# Patient Record
Sex: Female | Born: 2000 | Race: Black or African American | Hispanic: No | Marital: Single | Smoking: Never smoker
Health system: Southern US, Community
[De-identification: ages and names within clinical notes are randomized; demographics above are authoritative.]

## PROBLEM LIST (undated history)

## (undated) ENCOUNTER — Other Ambulatory Visit (HOSPITAL_COMMUNITY): Admission: EM | Discharge status: Absent without leave | Payer: Self-pay

## (undated) DIAGNOSIS — T50902A Poisoning by unspecified drugs, medicaments and biological substances, intentional self-harm, initial encounter: Secondary | ICD-10-CM

## (undated) DIAGNOSIS — D649 Anemia, unspecified: Secondary | ICD-10-CM

## (undated) DIAGNOSIS — F32A Depression, unspecified: Secondary | ICD-10-CM

## (undated) DIAGNOSIS — F329 Major depressive disorder, single episode, unspecified: Secondary | ICD-10-CM

---
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
## 2021-02-22 IMAGING — CR DG CHEST 2V
2 series · 2 of 2 positions shown · non-contrast
Comparison: 01/24/2020.  Chest CT 01/28/2020

CLINICAL DATA: Current treatment for tuberculosis.

EXAM:
CHEST - 2 VIEW

[w chest pa]
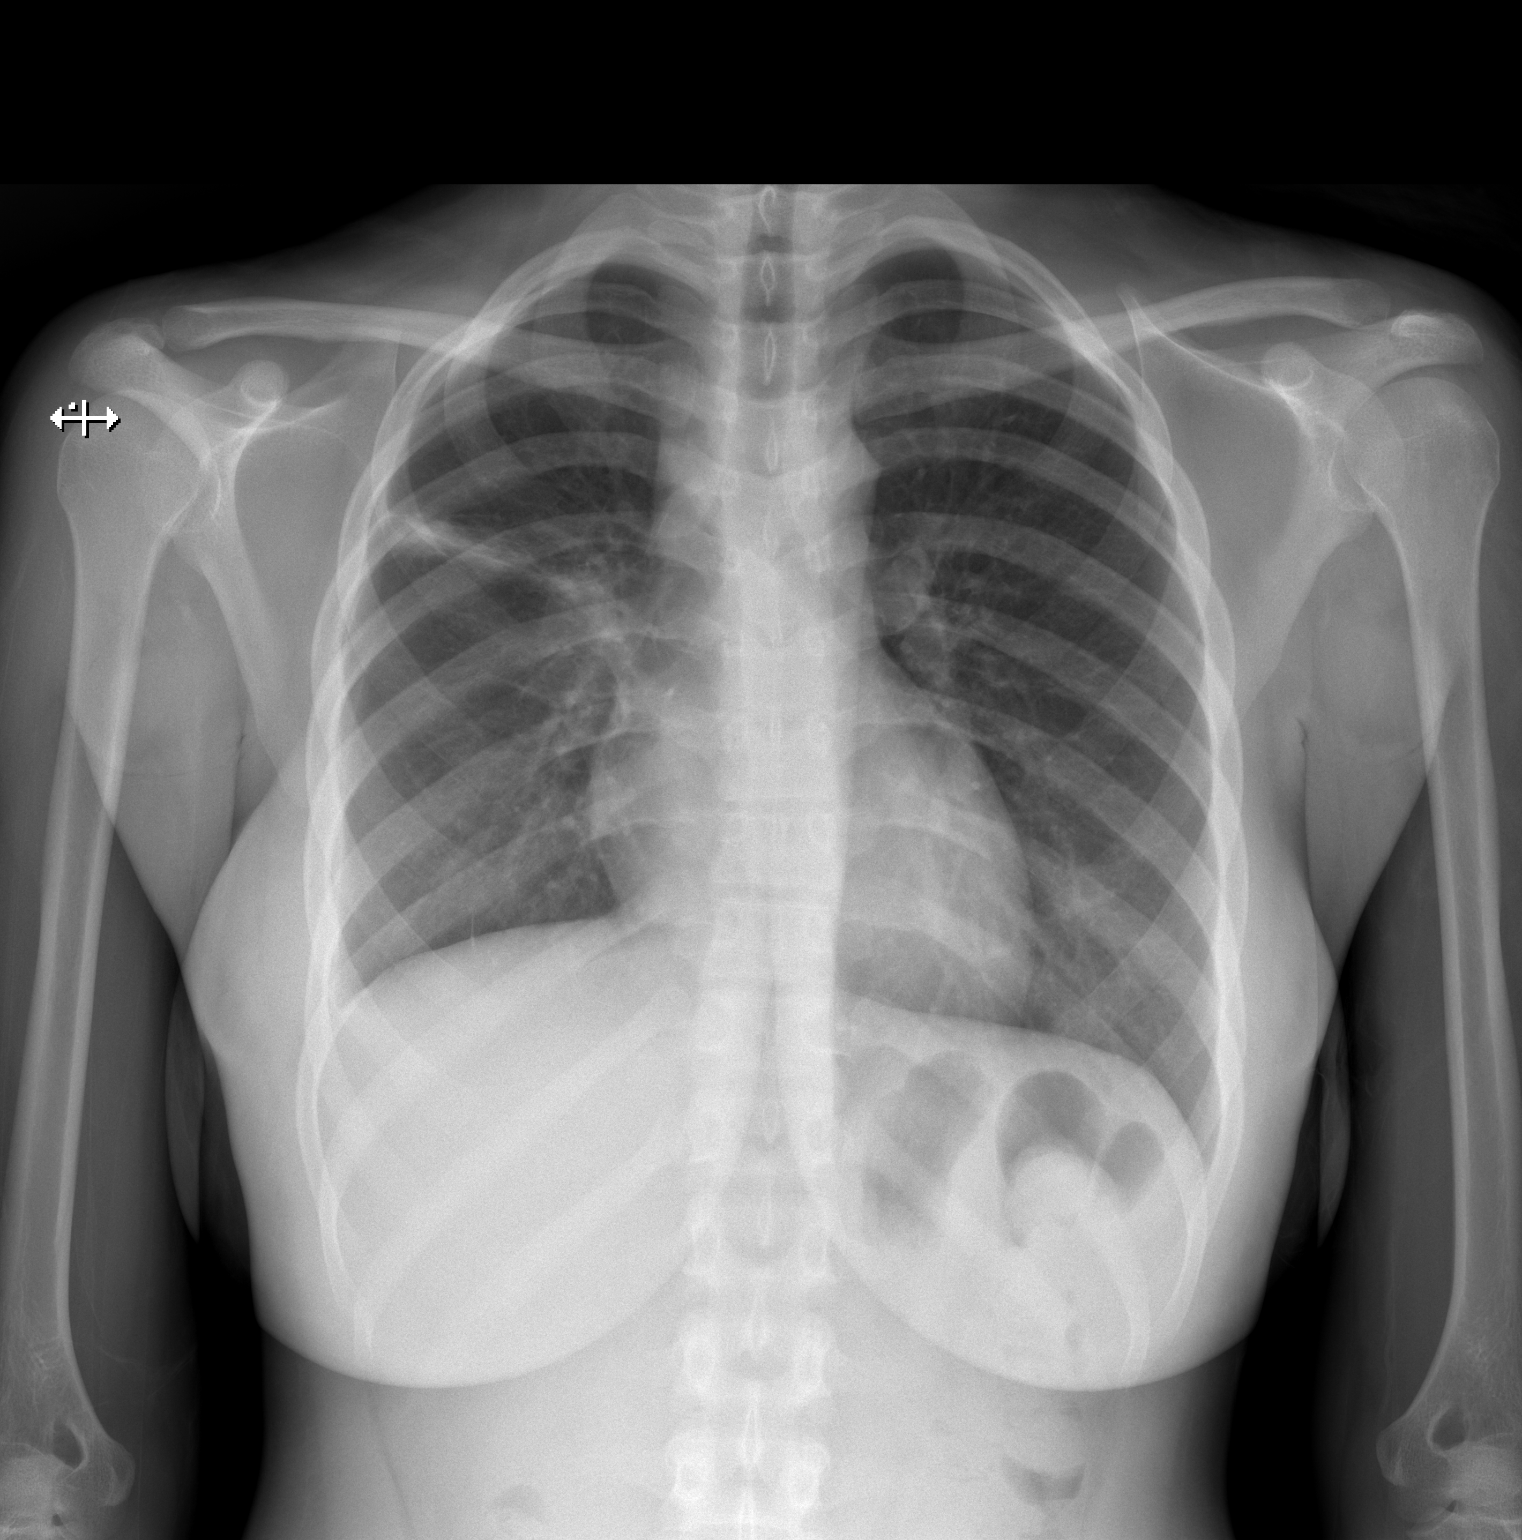

[w chest lat]
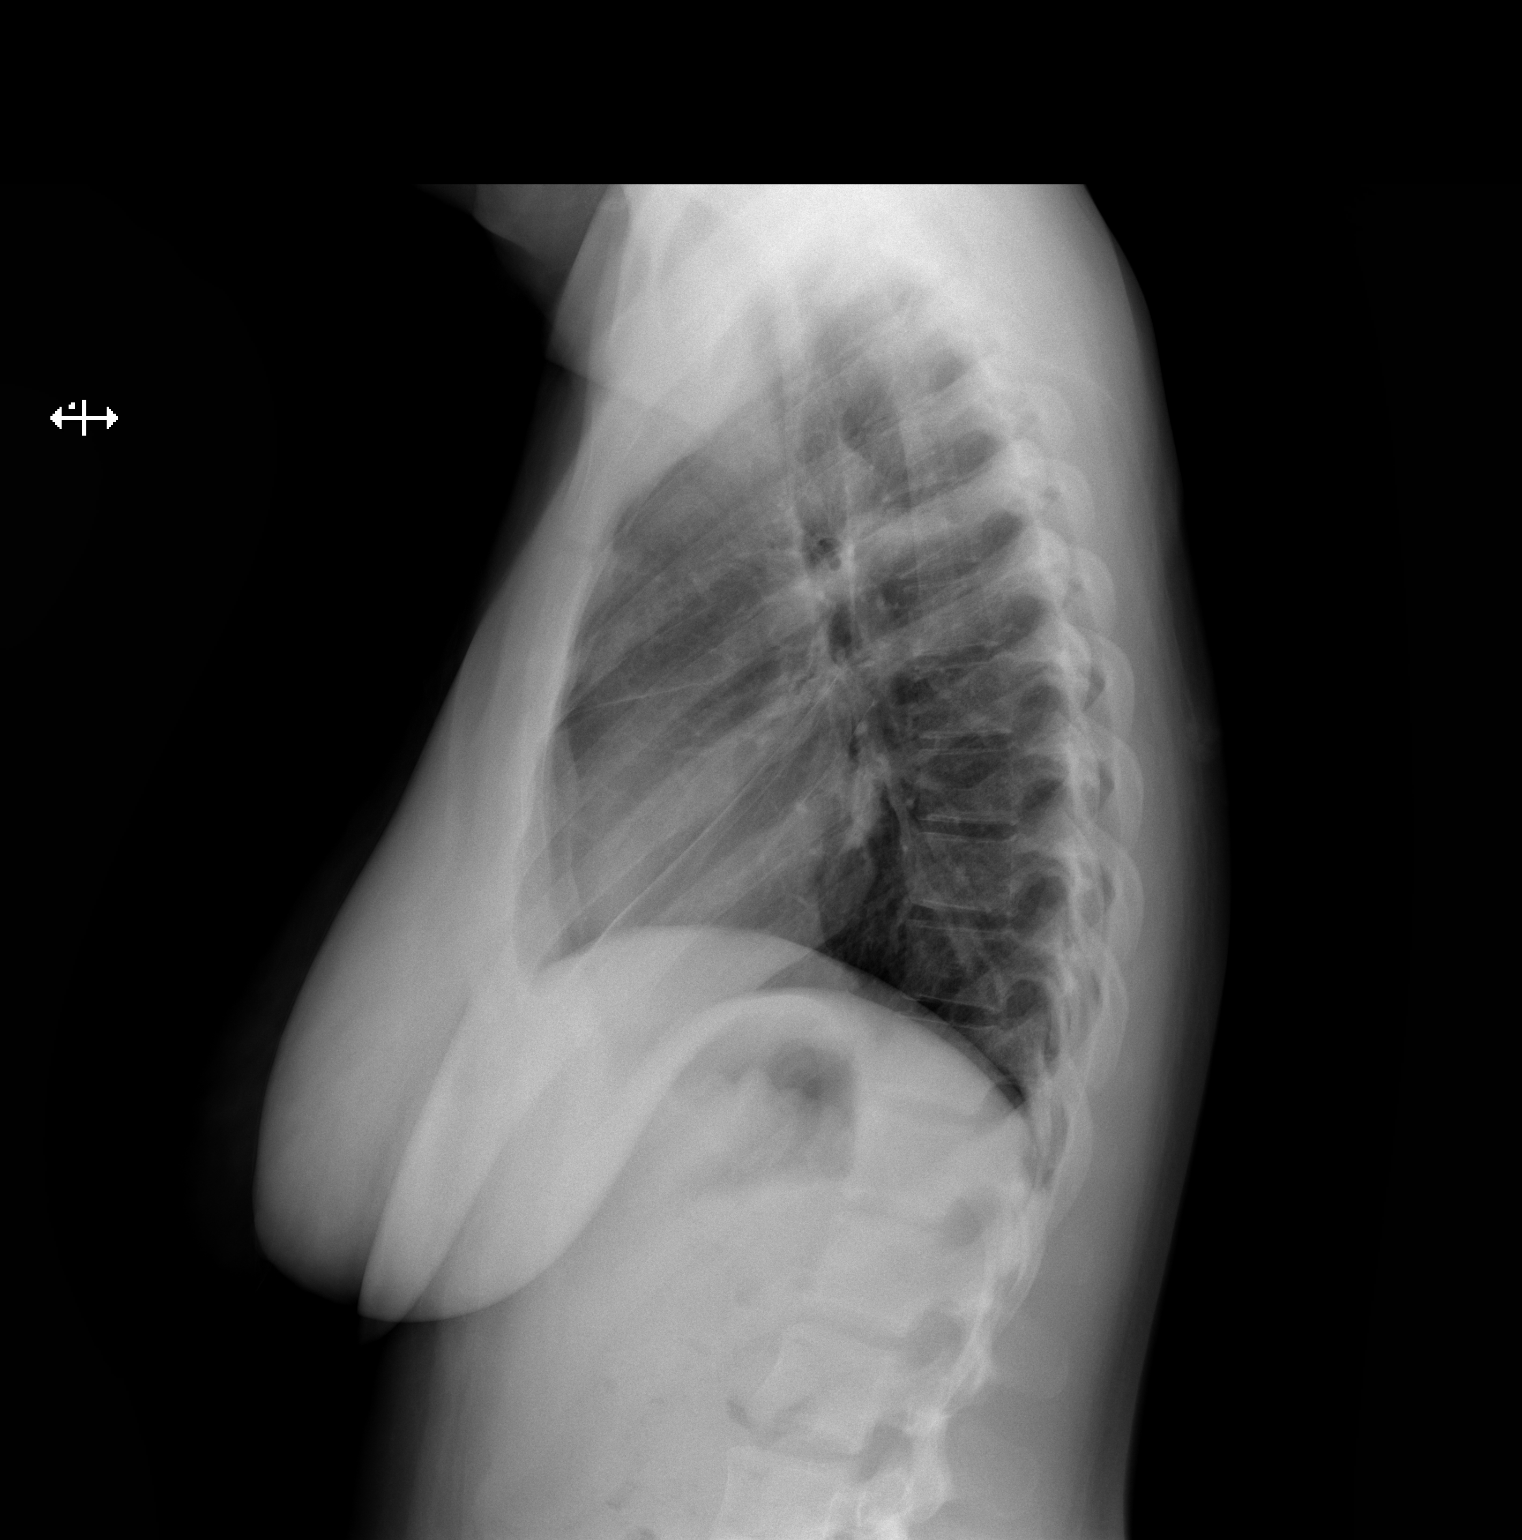

[2 of 2 positions shown; findings below may reference images not displayed]

FINDINGS: Interval improvement in right paratracheal adenopathy. Mild
improvement in patchy infiltrate in the right upper lobe. Interval
improvement in right pleural effusion

Left lung remains clear.
IMPRESSION: Interval improvement in right paratracheal adenopathy, right upper
lobe infiltrate and right pleural effusion. No new findings.

## 2021-05-01 ENCOUNTER — Other Ambulatory Visit: Payer: Self-pay

## 2021-05-01 ENCOUNTER — Ambulatory Visit (HOSPITAL_COMMUNITY)
Admission: EM | Admit: 2021-05-01 | Discharge: 2021-05-01 | Disposition: A | Discharge status: Home / Self Care | Payer: Medicaid Other | Attending: Student | Admitting: Student

## 2021-05-01 ENCOUNTER — Encounter (HOSPITAL_COMMUNITY): Payer: Self-pay | Admitting: Emergency Medicine

## 2021-05-01 DIAGNOSIS — Z113 Encounter for screening for infections with a predominantly sexual mode of transmission: Secondary | ICD-10-CM | POA: Insufficient documentation

## 2021-05-01 DIAGNOSIS — Z3202 Encounter for pregnancy test, result negative: Secondary | ICD-10-CM

## 2021-05-01 NOTE — ED Provider Notes (Signed)
[facility]    CSN: [id number] Arrival date & time: 05/01/21  1546      History   Chief Complaint Chief Complaint  Patient presents with   SEXUALLY TRANSMITTED DISEASE    HPI Patricia [name] is a 20 y.o. female presenting for STI screen and "scabs inside nose".  Medical history noncontributory.  Patient states multiple new partners, but that she has used condoms.  Slight smell but denies vaginal discharge, irritation, lesions.  Denies urinary symptoms.  She is requesting pregnancy test, last menstrual period was about 3 weeks ago.  Irregular periods.  Also with vague complaint of scabs inside nose for few months, denies trauma or nose picking.  1 episode of epistaxis.  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   Right upper lobe pulmonary infiltrate 01/28/2020   Supraclavicular adenopathy    Positive QuantiFERON-TB Gold test    Fever, unspecified 01/25/2020   Weight loss 01/24/2020   Anemia 01/23/2020   Suicide attempt (HCC)    Depression 05/24/2015   Overdose 05/23/2015   Suicide attempt by acetaminophen overdose (HCC) 05/23/2015    Past Surgical History:  Procedure Laterality Date   BRONCHIAL BRUSHINGS  01/30/2020   Procedure: BRONCHIAL BRUSHINGS;  Surgeon: Lorin Glass, MD;  Location: [facility] ENDOSCOPY;  Service: Pulmonary;;   BRONCHIAL NEEDLE ASPIRATION BIOPSY  01/30/2020   Procedure: BRONCHIAL NEEDLE ASPIRATION BIOPSIES;  Surgeon: Lorin Glass, MD;  Location: [facility] ENDOSCOPY;  Service: Pulmonary;;   BRONCHIAL WASHINGS  01/30/2020   Procedure: BRONCHIAL WASHINGS;  Surgeon: Lorin Glass, MD;  Location: [facility] ENDOSCOPY;  Service: Pulmonary;;   VIDEO BRONCHOSCOPY WITH ENDOBRONCHIAL ULTRASOUND N/A 01/30/2020   Procedure: VIDEO BRONCHOSCOPY WITH ENDOBRONCHIAL ULTRASOUND;  Surgeon: Lorin Glass,  MD;  Location: [facility] ENDOSCOPY;  Service: Pulmonary;  Laterality: N/A;    OB History   No obstetric history on file.      Home Medications    Prior to Admission medications   Medication Sig Start Date End Date Taking? Authorizing Provider  FLUoxetine (PROZAC) 20 MG capsule Take 1 capsule (20 mg total) by mouth daily. 04/02/20 05/02/20  Shirlean Mylar, MD  Iron, Ferrous Sulfate, 325 (65 Fe) MG TABS Take 1 tablet by mouth daily. 01/23/20   Orma Flaming, NP    Family History History reviewed. No pertinent family history.  Social History Social History   Tobacco Use   Smoking status: Never   Smokeless tobacco: Never  Substance Use Topics   Alcohol use: No   Drug use: No     Allergies   Other   Review of Systems Review of Systems  Constitutional:  Negative for chills and fever.  HENT:  Negative for sore throat.   Eyes:  Negative for pain and redness.  Respiratory:  Negative for shortness of breath.   Cardiovascular:  Negative for chest pain.  Gastrointestinal:  Negative for abdominal pain, diarrhea, nausea and vomiting.  Genitourinary:  Negative for decreased urine volume, difficulty urinating, dysuria, flank pain, frequency, genital sores, hematuria, menstrual problem, pelvic pain, urgency, vaginal bleeding, vaginal discharge and vaginal pain.  Musculoskeletal:  Negative for back pain.  Skin:  Negative for rash.  All other systems reviewed and are negative.   Physical Exam Triage Vital Signs ED Triage Vitals  Enc Vitals Group  BP 05/01/21 1729 125/80     Pulse Rate 05/01/21 1729 66     Resp 05/01/21 1729 17     Temp 05/01/21 1729 98.2 F (36.8 C)     Temp src --      SpO2 05/01/21 1729 99 %     Weight --      Height --      Head Circumference --      Peak Flow --      Pain Score 05/01/21 1726 0     Pain Loc --      Pain Edu? --      Excl. in [place]? --    No data found.  Updated Vital Signs BP 125/80   Pulse 66   Temp 98.2 F (36.8 C)   Resp 17    LMP 04/09/2021   SpO2 99%   Visual Acuity Right Eye Distance:   Left Eye Distance:   Bilateral Distance:    Right Eye Near:   Left Eye Near:    Bilateral Near:     Physical Exam Vitals reviewed.  Constitutional:      General: She is not in acute distress.    Appearance: Normal appearance. She is not ill-appearing.  HENT:     Head: Normocephalic and atraumatic.     Nose: Nose normal. No nasal deformity, septal deviation, signs of injury, laceration, nasal tenderness, mucosal edema, congestion or rhinorrhea.     Right Nostril: No foreign body, epistaxis, septal hematoma or occlusion.     Left Nostril: No foreign body, epistaxis, septal hematoma or occlusion.     Right Turbinates: Not enlarged, swollen or pale.     Left Turbinates: Not enlarged, swollen or pale.     Right Sinus: No maxillary sinus tenderness or frontal sinus tenderness.     Left Sinus: No maxillary sinus tenderness or frontal sinus tenderness.     Comments: NO epistaxis. Normal exam.    Mouth/Throat:     Mouth: Mucous membranes are moist.     Comments: Moist mucous membranes Eyes:     Extraocular Movements: Extraocular movements intact.     Pupils: Pupils are equal, round, and reactive to light.  Cardiovascular:     Rate and Rhythm: Normal rate and regular rhythm.     Heart sounds: Normal heart sounds.  Pulmonary:     Effort: Pulmonary effort is normal.     Breath sounds: Normal breath sounds. No wheezing, rhonchi or rales.  Abdominal:     General: Bowel sounds are normal. There is no distension.     Palpations: Abdomen is soft. There is no mass.     Tenderness: There is no abdominal tenderness. There is no right CVA tenderness, left CVA tenderness, guarding or rebound.  Skin:    General: Skin is warm.     Capillary Refill: Capillary refill takes less than 2 seconds.     Comments: Good skin turgor  Neurological:     General: No focal deficit present.     Mental Status: She is alert and oriented to  person, place, and time.  Psychiatric:        Mood and Affect: Mood normal.        Behavior: Behavior normal.     UC Treatments / Results  Labs (all labs ordered are listed, but only abnormal results are displayed) Labs Reviewed  POC URINE PREG, ED  CERVICOVAGINAL ANCILLARY ONLY    EKG   Radiology No results found.  Procedures Procedures (  including critical care time)  Medications Ordered in UC Medications - No data to display  Initial Impression / Assessment and Plan / UC Course  I have reviewed the triage vital signs and the nursing notes.  Pertinent labs & imaging results that were available during my care of the patient were reviewed by me and considered in my medical decision making (see chart for details).     This patient is a very pleasant 20 y.o. year old female presenting with vaginal odor and nasal irritation. Afebrile, nontachycardic, no reproducible abd pain or CVAT.  Negative urine pregnancy.  Multiple new female partners but has used condoms. Will send self-swab for G/C, trich, yeast, BV testing. Declines HIV, RPR. Safe sex precautions.   Rec vaseline for nasal irritation.  ED return precautions discussed. Patient verbalizes understanding and agreement.     Final Clinical Impressions(s) / UC Diagnoses   Final diagnoses:  Routine screening for STI (sexually transmitted infection)     Discharge Instructions      -We have sent testing for BV, yeast, and sexually transmitted infections. We will notify you of any positive results once they are received. If required, we will prescribe any medications you might need in 2-3 days. Please refrain from all sexual activity until treatment is complete.  -Seek additional medical attention if you develop fevers/chills, new/worsening abdominal pain, new/worsening vaginal discomfort/discharge, etc.  -I'll call in about 15 minutes if positive pregnancy test, the results will also go to your mychart -Try putting  vaseline on a q-tip and applying that inside the nose for irritation      ED Prescriptions   None    PDMP not reviewed this encounter.   Rhys Martini, PA-C 05/01/21 1753

## 2021-05-01 NOTE — ED Triage Notes (Signed)
Pt is present today with concerns with scabs on the inside of her nose. Pt states that she noticed it a couple months ago. Pt states that she also like to be tested for STD and pregnancy test. Denies any sx

## 2021-05-01 NOTE — Discharge Instructions (Addendum)
-  We have sent testing for BV, yeast, and sexually transmitted infections. We will notify you of any positive results once they are received. If required, we will prescribe any medications you might need in 2-3 days. Please refrain from all sexual activity until treatment is complete.  -Seek additional medical attention if you develop fevers/chills, new/worsening abdominal pain, new/worsening vaginal discomfort/discharge, etc.  -I'll call in about 15 minutes if positive pregnancy test, the results will also go to your mychart -Try putting vaseline on a q-tip and applying that inside the nose for irritation

## 2021-05-04 ENCOUNTER — Encounter: Payer: Self-pay | Admitting: Family Medicine

## 2021-05-04 LAB — CERVICOVAGINAL ANCILLARY ONLY
Bacterial Vaginitis (gardnerella): POSITIVE — AB
Candida Glabrata: NEGATIVE
Candida Vaginitis: POSITIVE — AB
Chlamydia: NEGATIVE
Comment: NEGATIVE
Comment: NEGATIVE
Comment: NEGATIVE
Comment: NEGATIVE
Comment: NEGATIVE
Comment: NORMAL
Neisseria Gonorrhea: NEGATIVE
Trichomonas: NEGATIVE

## 2021-05-04 LAB — POC URINE PREG, ED: Preg Test, Ur: NEGATIVE

## 2021-05-05 ENCOUNTER — Telehealth (HOSPITAL_COMMUNITY): Payer: Self-pay | Admitting: Emergency Medicine

## 2021-05-05 MED ORDER — METRONIDAZOLE 500 MG PO TABS: 500.0000 mg | ORAL_TABLET | Freq: Two times a day (BID) | ORAL | 0 refills | Status: DC

## 2021-05-05 MED ORDER — FLUCONAZOLE 150 MG PO TABS
150.0000 mg | ORAL_TABLET | Freq: Once | ORAL | 0 refills | Status: AC
Start: 2021-05-05 — End: 2021-05-05

## 2021-05-21 ENCOUNTER — Ambulatory Visit: Payer: Self-pay | Admitting: Family Medicine

## 2021-08-20 ENCOUNTER — Other Ambulatory Visit: Payer: Self-pay

## 2021-08-20 ENCOUNTER — Encounter (HOSPITAL_COMMUNITY): Payer: Self-pay | Admitting: Emergency Medicine

## 2021-08-20 ENCOUNTER — Ambulatory Visit (HOSPITAL_COMMUNITY)
Admission: EM | Admit: 2021-08-20 | Discharge: 2021-08-20 | Disposition: A | Discharge status: Home / Self Care | Payer: Medicaid Other | Attending: Emergency Medicine | Admitting: Emergency Medicine

## 2021-08-20 DIAGNOSIS — S81812A Laceration without foreign body, left lower leg, initial encounter: Secondary | ICD-10-CM | POA: Insufficient documentation

## 2021-08-20 DIAGNOSIS — Z113 Encounter for screening for infections with a predominantly sexual mode of transmission: Secondary | ICD-10-CM | POA: Diagnosis present

## 2021-08-20 NOTE — Discharge Instructions (Signed)
6 sutures have place ( 2 blue, 4 clear), please return in 7-10 days for removal  Watch for signs of infection such as increased swelling, tenderness, warmth, drainage if these occur please follow up as soon as possible  Avoid getting area soaking wet, may wet in shower cleanse with diluted soapy water then pat dry    Labs pending 2-3 days, you will be contacted if positive for any sti and treatment will be sent to the pharmacy, you will have to return to the clinic if positive for gonorrhea to receive treatment   Please refrain from having sex until labs results, if positive please refrain from having sex until treatment complete and symptoms resolve   If positive for HIV, Syphilis, Chlamydia  gonorrhea or trichomoniasis please notify partner or partners so they may tested as well  Moving forward, it is recommended you use some form of protection against the transmission of sti infections  such as condoms or dental dams with each sexual encounter

## 2021-08-20 NOTE — ED Triage Notes (Signed)
Left lower laceration occurred to day, cut on a broken glass  Patient requesting std check.  Denies symptoms

## 2021-08-21 LAB — CERVICOVAGINAL ANCILLARY ONLY
Bacterial Vaginitis (gardnerella): POSITIVE — AB
Candida Glabrata: NEGATIVE
Candida Vaginitis: NEGATIVE
Chlamydia: NEGATIVE
Comment: NEGATIVE
Comment: NEGATIVE
Comment: NEGATIVE
Comment: NEGATIVE
Comment: NEGATIVE
Comment: NORMAL
Neisseria Gonorrhea: NEGATIVE
Trichomonas: NEGATIVE

## 2021-08-23 DIAGNOSIS — S81812A Laceration without foreign body, left lower leg, initial encounter: Secondary | ICD-10-CM | POA: Diagnosis not present

## 2021-08-23 NOTE — ED Provider Notes (Signed)
[facility]    CSN: [id number] Arrival date & time: 08/20/21  1822      History   Chief Complaint Chief Complaint  Patient presents with   Laceration    HPI Patricia [name] is a 21 y.o. female.   Patient presents with laceration on lateral aspect of left lower leg occurring today after site was cut with glass.  Completed irrigation with tap water and covered with bandage.  Bleeding has subsided.  Has not attempted further treatment.  Denies numbness, tingling, fever, chills, purulent drainage, swelling.  Requesting STI testing.  Denies all symptoms.  No known exposure.  Sexually active, 1 partner, no condom use.  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   Right upper lobe pulmonary infiltrate 01/28/2020   Supraclavicular adenopathy    Positive QuantiFERON-TB Gold test    Fever, unspecified 01/25/2020   Weight loss 01/24/2020   Anemia 01/23/2020   Suicide attempt ([place])    Depression 05/24/2015   Overdose 05/23/2015   Suicide attempt by acetaminophen overdose ([place]) 05/23/2015    Past Surgical History:  Procedure Laterality Date   BRONCHIAL BRUSHINGS  01/30/2020   Procedure: BRONCHIAL BRUSHINGS;  Surgeon: Candee Furbish, MD;  Location: [facility] ENDOSCOPY;  Service: Pulmonary;;   BRONCHIAL NEEDLE ASPIRATION BIOPSY  01/30/2020   Procedure: BRONCHIAL NEEDLE ASPIRATION BIOPSIES;  Surgeon: Candee Furbish, MD;  Location: [facility] ENDOSCOPY;  Service: Pulmonary;;   BRONCHIAL WASHINGS  01/30/2020   Procedure: BRONCHIAL WASHINGS;  Surgeon: Candee Furbish, MD;  Location: [facility] ENDOSCOPY;  Service: Pulmonary;;   VIDEO BRONCHOSCOPY WITH ENDOBRONCHIAL ULTRASOUND N/A 01/30/2020   Procedure: VIDEO BRONCHOSCOPY WITH ENDOBRONCHIAL ULTRASOUND;  Surgeon: Candee Furbish, MD;  Location: [facility] ENDOSCOPY;  Service: Pulmonary;   Laterality: N/A;    OB History   No obstetric history on file.      Home Medications    Prior to Admission medications   Medication Sig Start Date End Date Taking? Authorizing Provider  FLUoxetine (PROZAC) 20 MG capsule Take 1 capsule (20 mg total) by mouth daily. 04/02/20 05/02/20  Gladys Damme, MD  Iron, Ferrous Sulfate, 325 (65 Fe) MG TABS Take 1 tablet by mouth daily. 01/23/20   Anthoney Harada, NP  metroNIDAZOLE (FLAGYL) 500 MG tablet Take 1 tablet (500 mg total) by mouth 2 (two) times daily. Patient not taking: Reported on 08/20/2021 05/05/21   Lamptey, Myrene Galas, MD    Family History Family History  Problem Relation Age of Onset   Healthy Mother    Healthy Father     Social History Social History   Tobacco Use   Smoking status: Never   Smokeless tobacco: Never  Vaping Use   Vaping Use: Some days  Substance Use Topics   Alcohol use: No   Drug use: No     Allergies   Other   Review of Systems Review of Systems  Constitutional: Negative.   Respiratory: Negative.    Gastrointestinal: Negative.   Genitourinary: Negative.   Musculoskeletal: Negative.   Skin:  Positive for wound. Negative for color change, pallor and rash.  Neurological: Negative.     Physical Exam Triage Vital Signs ED Triage Vitals  Enc Vitals Group     BP 08/20/21 1911 116/75     Pulse Rate 08/20/21 1911 77  Resp 08/20/21 1911 18     Temp 08/20/21 1911 98.5 F (36.9 C)     Temp Source 08/20/21 1911 Oral     SpO2 08/20/21 1911 100 %     Weight --      Height --      Head Circumference --      Peak Flow --      Pain Score 08/20/21 1908 0     Pain Loc --      Pain Edu? --      Excl. in [place]? --    No data found.  Updated Vital Signs BP 116/75 (BP Location: Left Arm)    Pulse 77    Temp 98.5 F (36.9 C) (Oral)    Resp 18    LMP 08/08/2021    SpO2 100%   Visual Acuity Right Eye Distance:   Left Eye Distance:   Bilateral Distance:    Right Eye Near:   Left Eye Near:     Bilateral Near:     Physical Exam Constitutional:      Appearance: Normal appearance.  HENT:     Head: Normocephalic.  Eyes:     Extraocular Movements: Extraocular movements intact.  Pulmonary:     Effort: Pulmonary effort is normal.  Genitourinary:    Comments: Deferred self collect vaginal swab Skin:    Comments: 3 x 1 cm laceration on the lateral left lower leg, no drainage noted, minimal swelling with tenderness  Neurological:     General: No focal deficit present.     Mental Status: She is alert and oriented to person, place, and time.  Psychiatric:        Mood and Affect: Mood normal.        Behavior: Behavior normal.     UC Treatments / Results  Labs (all labs ordered are listed, but only abnormal results are displayed) Labs Reviewed  CERVICOVAGINAL ANCILLARY ONLY - Abnormal; Notable for the following components:      Result Value   Bacterial Vaginitis (gardnerella) Positive (*)    All other components within normal limits    EKG   Radiology No results found.  Procedures Laceration Repair  Date/Time: 08/23/2021 10:13 AM Performed by: Hans Eden, NP Authorized by: Hans Eden, NP   Consent:    Consent obtained:  Verbal   Consent given by:  Patient   Risks, benefits, and alternatives were discussed: yes     Risks discussed:  Infection, pain and need for additional repair Universal protocol:    Procedure explained and questions answered to patient or proxy's satisfaction: yes     Patient identity confirmed:  Verbally with patient Anesthesia:    Anesthesia method:  Local infiltration   Local anesthetic:  Lidocaine 1% WITH epi Laceration details:    Location:  Leg   Leg location:  L lower leg   Length (cm):  3   Depth (mm):  1 Pre-procedure details:    Preparation:  Patient was prepped and draped in usual sterile fashion Exploration:    Limited defect created (wound extended): no     Wound exploration: entire depth of wound visualized      Contaminated: no   Treatment:    Area cleansed with:  Saline   Amount of cleaning:  Standard   Irrigation solution:  Sterile saline   Irrigation volume:  10   Irrigation method:  Syringe   Debridement:  None   Undermining:  None Skin repair:  Repair method:  Sutures   Suture size:  3-0 and 6-0   Suture material:  Prolene and nylon   Suture technique:  Simple interrupted   Number of sutures:  6 (2 prolene, 4 nylon) Approximation:    Approximation:  Close Repair type:    Repair type:  Simple Post-procedure details:    Dressing:  Open (no dressing)   Procedure completion:  Tolerated (including critical care time)  Medications Ordered in UC Medications - No data to display  Initial Impression / Assessment and Plan / UC Course  I have reviewed the triage vital signs and the nursing notes.  Pertinent labs & imaging results that were available during my care of the patient were reviewed by me and considered in my medical decision making (see chart for details).  Laceration of left lower leg, initial encounter Routine screening for STI  Laceration repaired with 6 sutures, tolerated well, given return precautions to return in 7 to 10 days for suture removal, given signs of infection and notified when to return for further evaluation, Daily cleansing with diluted soapy water, pat dry, may leave open to air  STI screening pending, will treat per protocol, advised abstinence until labs results and/or treatment is complete, advised condom use during all sexual encounters moving forward Final Clinical Impressions(s) / UC Diagnoses   Final diagnoses:  Laceration of left lower leg, initial encounter  Routine screening for STI (sexually transmitted infection)     Discharge Instructions      6 sutures have place ( 2 blue, 4 clear), please return in 7-10 days for removal  Watch for signs of infection such as increased swelling, tenderness, warmth, drainage if these occur please  follow up as soon as possible  Avoid getting area soaking wet, may wet in shower cleanse with diluted soapy water then pat dry    Labs pending 2-3 days, you will be contacted if positive for any sti and treatment will be sent to the pharmacy, you will have to return to the clinic if positive for gonorrhea to receive treatment   Please refrain from having sex until labs results, if positive please refrain from having sex until treatment complete and symptoms resolve   If positive for HIV, Syphilis, Chlamydia  gonorrhea or trichomoniasis please notify partner or partners so they may tested as well  Moving forward, it is recommended you use some form of protection against the transmission of sti infections  such as condoms or dental dams with each sexual encounter     ED Prescriptions   None    PDMP not reviewed this encounter.   Hans Eden, NP 08/23/21 1015

## 2021-08-24 ENCOUNTER — Telehealth (HOSPITAL_COMMUNITY): Payer: Self-pay | Admitting: Emergency Medicine

## 2021-08-24 MED ORDER — METRONIDAZOLE 0.75 % VA GEL: 1.0000 | Freq: Every day | VAGINAL | 0 refills | Status: AC

## 2021-09-02 ENCOUNTER — Encounter: Payer: Self-pay | Admitting: Family Medicine

## 2021-09-04 ENCOUNTER — Ambulatory Visit (INDEPENDENT_AMBULATORY_CARE_PROVIDER_SITE_OTHER): Payer: Medicaid Other | Admitting: Family Medicine

## 2021-09-04 DIAGNOSIS — Z91199 Patient's noncompliance with other medical treatment and regimen due to unspecified reason: Secondary | ICD-10-CM

## 2021-09-04 NOTE — Progress Notes (Signed)
No show. Will send letter.  Shirlean Mylar, MD [facility], PGY-3

## 2021-09-07 ENCOUNTER — Encounter: Payer: Self-pay | Admitting: Family Medicine

## 2021-09-07 ENCOUNTER — Other Ambulatory Visit: Payer: Self-pay

## 2021-09-07 ENCOUNTER — Ambulatory Visit (INDEPENDENT_AMBULATORY_CARE_PROVIDER_SITE_OTHER): Payer: Medicaid Other | Admitting: Family Medicine

## 2021-09-07 VITALS — BP 107/71 | HR 97 | Ht 62.0 in | Wt 165.1 lb

## 2021-09-07 DIAGNOSIS — D508 Other iron deficiency anemias: Secondary | ICD-10-CM

## 2021-09-07 DIAGNOSIS — N920 Excessive and frequent menstruation with regular cycle: Secondary | ICD-10-CM | POA: Diagnosis not present

## 2021-09-07 DIAGNOSIS — F332 Major depressive disorder, recurrent severe without psychotic features: Secondary | ICD-10-CM | POA: Diagnosis not present

## 2021-09-07 NOTE — Assessment & Plan Note (Signed)
Still present, denies SI.  No longer taking fluoxetine and is not interested in restarting at this time.  I gave her the psychology today website and she was able to find a few therapists who accepts her insurance.  She is to reach out later this week.

## 2021-09-07 NOTE — Patient Instructions (Addendum)
It was nice seeing you today!  Come back for labs tomorrow.  Stay well, Patricia Deeds, MD [facility] [phone]  --  Make sure to check out at the front desk before you leave today.  Please arrive at least 15 minutes prior to your scheduled appointments.  If you had blood work today, I will send you a MyChart message or a letter if results are normal. Otherwise, I will give you a call.  If you had a referral placed, they will call you to set up an appointment. Please give [place] a call if you don't hear back in the next 2 weeks.  If you need additional refills before your next appointment, please call your pharmacy first.

## 2021-09-07 NOTE — Assessment & Plan Note (Signed)
Checking CBC and ferritin, future orders placed as lab has closed.  No longer taking iron supplement and OCP.  Further management pending lab results.  I did discuss IUD as an option to help decrease bleeding but patient would still prefer pills.

## 2021-09-07 NOTE — Progress Notes (Signed)
SUBJECTIVE:   CHIEF COMPLAINT / HPI:  Chief Complaint  Patient presents with   Suture / Staple Removal    Patient went to urgent care on 1/12 for laceration of the left lower leg, 6 sutures placed.  Was supposed to return in 7 to 10 days for suture removal. Thinks some of the sutures fell out.  Here for blood work for anemia. She has a history of iron deficiency anemia secondary to heavy menses and has required blood transfusions in the past.  She states she had heavy menses at the beginning of the month and then menses recurred 2 weeks later which is unusual for her (usually they are regular).  Bleeding lasted for 7 days total with 5 days of heavier bleeding requiring changing pads 2-3 times a day.  She does endorse some fatigue and lightheadedness.  Previously prescribed OCP and iron supplement but states that she only took these for about 1 month and has not taken them in over a year.  She has a history of depression with suicide attempt with acetaminophen overdose years ago.  He is amenable to therapy but states she has not been able to find a therapist who takes her insurance.  Previously on fluoxetine but states that she stopped taking this after about 1 month and has not taken this for over a year.  Denies current SI.   PERTINENT  PMH / PSH: Depression, iron deficiency anemia, heavy menses  Patient Care Team: Gladys Damme, MD as PCP - General (Family Medicine)   OBJECTIVE:   BP 107/71    Pulse 97    Ht 5\' 2"  (1.575 m)    Wt 165 lb 2 oz (74.9 kg)    LMP 08/31/2021    SpO2 100%    BMI 30.20 kg/m   Physical Exam Constitutional:      General: She is not in acute distress.    Appearance: She is obese.  HENT:     Head: Normocephalic and atraumatic.  Cardiovascular:     Rate and Rhythm: Normal rate.  Pulmonary:     Effort: Pulmonary effort is normal. No respiratory distress.  Musculoskeletal:     Cervical back: Neck supple.  Skin:    General: Skin is warm and dry.      Comments: Left lower extremity with healing laceration, 3 sutures visualized (1 of which was already loosened)  Neurological:     Mental Status: She is alert.  Psychiatric:     Comments: Affect blunted, intermittently tearful      Depression screen [facility] 2/9 09/07/2021  Decreased Interest 1  Down, Depressed, Hopeless 3  PHQ - 2 Score 4  Altered sleeping 1  Tired, decreased energy 2  Change in appetite 3  Feeling bad or failure about yourself  2  Trouble concentrating 0  Moving slowly or fidgety/restless 0  Suicidal thoughts 0  PHQ-9 Score 12     {Show previous vital signs (optional):23777}    ASSESSMENT/PLAN:   Suture removal Only 3 of 6 sutures were visualized in the left lower leg, likely that these have fallen out on their own.  Area was cleansed with alcohol swab.  3 sutures removed with scissors without complication.  Anemia Checking CBC and ferritin, future orders placed as lab has closed.  No longer taking iron supplement and OCP.  Further management pending lab results.  I did discuss IUD as an option to help decrease bleeding but patient would still prefer pills.  Depression Still present,  denies SI.  No longer taking fluoxetine and is not interested in restarting at this time.  I gave her the psychology today website and she was able to find a few therapists who accepts her insurance.  She is to reach out later this week.    Return in about 4 weeks (around 10/05/2021) for f/u depression, heavy menses.   Zola Button, MD [place]

## 2021-09-08 ENCOUNTER — Other Ambulatory Visit: Payer: Self-pay

## 2021-09-08 ENCOUNTER — Other Ambulatory Visit: Payer: Medicaid Other

## 2021-09-08 DIAGNOSIS — D508 Other iron deficiency anemias: Secondary | ICD-10-CM | POA: Diagnosis not present

## 2021-09-08 DIAGNOSIS — N920 Excessive and frequent menstruation with regular cycle: Secondary | ICD-10-CM

## 2021-09-09 LAB — CBC
Hematocrit: 32.7 % — ABNORMAL LOW (ref 34.0–46.6)
Hemoglobin: 9.7 g/dL — ABNORMAL LOW (ref 11.1–15.9)
MCH: 22.4 pg — ABNORMAL LOW (ref 26.6–33.0)
MCHC: 29.7 g/dL — ABNORMAL LOW (ref 31.5–35.7)
MCV: 75 fL — ABNORMAL LOW (ref 79–97)
Platelets: 436 10*3/uL (ref 150–450)
RBC: 4.34 x10E6/uL (ref 3.77–5.28)
RDW: 15.9 % — ABNORMAL HIGH (ref 11.7–15.4)
WBC: 8.7 10*3/uL (ref 3.4–10.8)

## 2021-09-09 LAB — FERRITIN: Ferritin: 6 ng/mL — ABNORMAL LOW (ref 15–77)

## 2021-12-17 ENCOUNTER — Encounter: Payer: Self-pay | Admitting: Family Medicine

## 2022-01-12 ENCOUNTER — Encounter: Payer: Self-pay | Admitting: *Deleted

## 2022-01-29 ENCOUNTER — Other Ambulatory Visit (HOSPITAL_COMMUNITY)
Admission: RE | Admit: 2022-01-29 | Discharge: 2022-01-29 | Disposition: A | Discharge status: Home / Self Care | Payer: Medicaid Other | Source: Ambulatory Visit | Attending: Family Medicine | Admitting: Family Medicine

## 2022-01-29 ENCOUNTER — Ambulatory Visit (INDEPENDENT_AMBULATORY_CARE_PROVIDER_SITE_OTHER): Payer: Medicaid Other | Admitting: Family Medicine

## 2022-01-29 VITALS — BP 119/53 | HR 77 | Ht 62.0 in | Wt 148.5 lb

## 2022-01-29 DIAGNOSIS — Z789 Other specified health status: Secondary | ICD-10-CM

## 2022-01-29 DIAGNOSIS — Z1159 Encounter for screening for other viral diseases: Secondary | ICD-10-CM

## 2022-01-29 DIAGNOSIS — K5900 Constipation, unspecified: Secondary | ICD-10-CM

## 2022-01-29 DIAGNOSIS — N898 Other specified noninflammatory disorders of vagina: Secondary | ICD-10-CM

## 2022-01-29 DIAGNOSIS — Z113 Encounter for screening for infections with a predominantly sexual mode of transmission: Secondary | ICD-10-CM | POA: Insufficient documentation

## 2022-01-29 DIAGNOSIS — Z3009 Encounter for other general counseling and advice on contraception: Secondary | ICD-10-CM | POA: Diagnosis not present

## 2022-01-29 DIAGNOSIS — Z114 Encounter for screening for human immunodeficiency virus [HIV]: Secondary | ICD-10-CM | POA: Diagnosis not present

## 2022-01-29 DIAGNOSIS — A159 Respiratory tuberculosis unspecified: Secondary | ICD-10-CM | POA: Diagnosis not present

## 2022-01-29 DIAGNOSIS — N921 Excessive and frequent menstruation with irregular cycle: Secondary | ICD-10-CM | POA: Diagnosis not present

## 2022-01-29 DIAGNOSIS — D508 Other iron deficiency anemias: Secondary | ICD-10-CM

## 2022-01-29 DIAGNOSIS — D500 Iron deficiency anemia secondary to blood loss (chronic): Secondary | ICD-10-CM | POA: Diagnosis not present

## 2022-01-29 LAB — POCT URINE PREGNANCY: Preg Test, Ur: NEGATIVE

## 2022-01-29 LAB — POCT WET PREP (WET MOUNT)
Clue Cells Wet Prep Whiff POC: POSITIVE
Trichomonas Wet Prep HPF POC: ABSENT
WBC, Wet Prep HPF POC: NONE SEEN

## 2022-01-29 MED ORDER — NORGESTIMATE-ETH ESTRADIOL 0.25-35 MG-MCG PO TABS: 1.0000 | ORAL_TABLET | Freq: Every day | ORAL | 11 refills | Status: DC

## 2022-01-29 MED ORDER — POLYETHYLENE GLYCOL 3350 17 GM/SCOOP PO POWD: 17.0000 g | Freq: Two times a day (BID) | ORAL | 1 refills | Status: DC | PRN

## 2022-01-29 MED ORDER — IRON (FERROUS SULFATE) 325 (65 FE) MG PO TABS: 1.0000 | ORAL_TABLET | Freq: Every day | ORAL | 11 refills | Status: DC

## 2022-01-29 NOTE — Assessment & Plan Note (Signed)
Check CBC today. Refilled iron pills, rx for OCPs.

## 2022-01-30 ENCOUNTER — Encounter: Payer: Self-pay | Admitting: Family Medicine

## 2022-01-30 LAB — CBC WITH DIFFERENTIAL/PLATELET
Basophils Absolute: 0.1 10*3/uL (ref 0.0–0.2)
Basos: 1 %
EOS (ABSOLUTE): 0.1 10*3/uL (ref 0.0–0.4)
Eos: 2 %
Hematocrit: 34.9 % (ref 34.0–46.6)
Hemoglobin: 10.6 g/dL — ABNORMAL LOW (ref 11.1–15.9)
Immature Grans (Abs): 0 10*3/uL (ref 0.0–0.1)
Immature Granulocytes: 0 %
Lymphocytes Absolute: 2.2 10*3/uL (ref 0.7–3.1)
Lymphs: 37 %
MCH: 23.8 pg — ABNORMAL LOW (ref 26.6–33.0)
MCHC: 30.4 g/dL — ABNORMAL LOW (ref 31.5–35.7)
MCV: 78 fL — ABNORMAL LOW (ref 79–97)
Monocytes Absolute: 0.3 10*3/uL (ref 0.1–0.9)
Monocytes: 5 %
Neutrophils Absolute: 3.3 10*3/uL (ref 1.4–7.0)
Neutrophils: 55 %
Platelets: 333 10*3/uL (ref 150–450)
RBC: 4.45 x10E6/uL (ref 3.77–5.28)
RDW: 18.2 % — ABNORMAL HIGH (ref 11.7–15.4)
WBC: 5.9 10*3/uL (ref 3.4–10.8)

## 2022-01-30 LAB — RPR: RPR Ser Ql: NONREACTIVE

## 2022-01-30 LAB — HCV AB W REFLEX TO QUANT PCR: HCV Ab: NONREACTIVE

## 2022-01-30 LAB — HCV INTERPRETATION

## 2022-01-30 LAB — HIV ANTIBODY (ROUTINE TESTING W REFLEX): HIV Screen 4th Generation wRfx: NONREACTIVE

## 2022-02-01 LAB — CERVICOVAGINAL ANCILLARY ONLY
Chlamydia: NEGATIVE
Comment: NEGATIVE
Comment: NORMAL
Neisseria Gonorrhea: NEGATIVE

## 2022-04-15 ENCOUNTER — Telehealth: Payer: Self-pay

## 2022-04-15 NOTE — Telephone Encounter (Signed)
Patient calls nurse line requesting refill on metronidazole. She reports vaginal odor and white discharge. She denies abdominal pain, fever, vaginal itching or new sexual partner.   Patient reports that she has the same symptoms as when diagnosed with BV in January. Advised that appointment will likely be needed, however, we do not have any appointments until next week. Please advise if medication can be sent in.   Thanks.   Veronda Prude, RN

## 2022-04-16 NOTE — Telephone Encounter (Signed)
Called patient regarding communication about vaginal discharge. Patient reports primarily the smell is bothering the most, but is not having significant pain or discharge. Discussed with patient that it is not medically appropriate to prescribe metronidazole with a visit as her symptoms are not necessarily due to BV. Patient expressed understanding, and I instructed her to go to urgent care if unable to wait until an appointment next week.

## 2022-05-06 NOTE — Telephone Encounter (Signed)
Opened in error

## 2022-06-20 ENCOUNTER — Ambulatory Visit
Admission: EM | Admit: 2022-06-20 | Discharge: 2022-06-20 | Disposition: A | Discharge status: Home / Self Care | Payer: Medicaid Other | Attending: Physician Assistant | Admitting: Physician Assistant

## 2022-06-20 DIAGNOSIS — Z3202 Encounter for pregnancy test, result negative: Secondary | ICD-10-CM | POA: Diagnosis not present

## 2022-06-20 DIAGNOSIS — S80212A Abrasion, left knee, initial encounter: Secondary | ICD-10-CM | POA: Insufficient documentation

## 2022-06-20 DIAGNOSIS — S80211A Abrasion, right knee, initial encounter: Secondary | ICD-10-CM | POA: Insufficient documentation

## 2022-06-20 DIAGNOSIS — Z113 Encounter for screening for infections with a predominantly sexual mode of transmission: Secondary | ICD-10-CM | POA: Insufficient documentation

## 2022-06-20 LAB — POCT URINALYSIS DIP (MANUAL ENTRY)
Bilirubin, UA: NEGATIVE
Blood, UA: NEGATIVE
Glucose, UA: NEGATIVE mg/dL
Leukocytes, UA: NEGATIVE
Nitrite, UA: NEGATIVE
Spec Grav, UA: 1.02 (ref 1.010–1.025)
Urobilinogen, UA: 1 E.U./dL
pH, UA: 7 (ref 5.0–8.0)

## 2022-06-20 LAB — POCT URINE PREGNANCY: Preg Test, Ur: NEGATIVE

## 2022-06-20 NOTE — ED Provider Notes (Signed)
[name]    CSN: [id number] Arrival date & time: 06/20/22  1145      History   Chief Complaint Chief Complaint  Patient presents with   Wound Check   STD Testing    HPI Patricia [name] is a 21 y.o. female.   Here today for STD screening.  She does not report current symptoms.  She denies any known exposures.  She also would like evaluation of abrasions to both knees anteriorly after falling a few weeks ago.  She has been keeping Band-Aids over wounds.  The history is provided by the patient.  Wound Check Pertinent negatives include no abdominal pain and no shortness of breath.    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   Right upper lobe pulmonary infiltrate 01/28/2020   Supraclavicular adenopathy    Positive QuantiFERON-TB Gold test    Weight loss 01/24/2020   Anemia 01/23/2020   Suicide attempt (HCC)    Depression 05/24/2015   Overdose 05/23/2015   Suicide attempt by acetaminophen overdose (HCC) 05/23/2015    Past Surgical History:  Procedure Laterality Date   BRONCHIAL BRUSHINGS  01/30/2020   Procedure: BRONCHIAL BRUSHINGS;  Surgeon: Lorin Glass, MD;  Location: [facility] ENDOSCOPY;  Service: Pulmonary;;   BRONCHIAL NEEDLE ASPIRATION BIOPSY  01/30/2020   Procedure: BRONCHIAL NEEDLE ASPIRATION BIOPSIES;  Surgeon: Lorin Glass, MD;  Location: [facility] ENDOSCOPY;  Service: Pulmonary;;   BRONCHIAL WASHINGS  01/30/2020   Procedure: BRONCHIAL WASHINGS;  Surgeon: Lorin Glass, MD;  Location: [facility] ENDOSCOPY;  Service: Pulmonary;;   VIDEO BRONCHOSCOPY WITH ENDOBRONCHIAL ULTRASOUND N/A 01/30/2020   Procedure: VIDEO BRONCHOSCOPY WITH ENDOBRONCHIAL ULTRASOUND;  Surgeon: Lorin Glass, MD;  Location: [facility] ENDOSCOPY;  Service:  Pulmonary;  Laterality: N/A;    OB History   No obstetric history on file.      Home Medications    Prior to Admission medications   Medication Sig Start Date End Date Taking? Authorizing Provider  FLUoxetine (PROZAC) 20 MG capsule Take 1 capsule (20 mg total) by mouth daily. 04/02/20 05/02/20  Shirlean Mylar, MD  Iron, Ferrous Sulfate, 325 (65 Fe) MG TABS Take 1 tablet by mouth daily. 01/29/22   Shirlean Mylar, MD  norgestimate-ethinyl estradiol (ORTHO-CYCLEN) 0.25-35 MG-MCG tablet Take 1 tablet by mouth daily. 01/29/22   Shirlean Mylar, MD  polyethylene glycol powder [facility]) 17 GM/SCOOP powder Take 17 g by mouth 2 (two) times daily as needed. 01/29/22   Shirlean Mylar, MD    Family History Family History  Problem Relation Age of Onset   Healthy Mother    Healthy Father     Social History Social History   Tobacco Use   Smoking status: Never   Smokeless tobacco: Never  Vaping Use   Vaping Use: Some days  Substance Use Topics   Alcohol use: No   Drug use: No     Allergies   Other   Review of Systems Review of Systems  Constitutional:  Negative for chills and fever.  Eyes:  Negative for discharge and redness.  Respiratory:  Negative for shortness of breath.   Gastrointestinal:  Negative for abdominal pain, nausea and vomiting.  Genitourinary:  Negative for genital sores and vaginal discharge.  Musculoskeletal:  Negative for  back pain.  Skin:  Positive for wound. Negative for color change.     Physical Exam Triage Vital Signs ED Triage Vitals  Enc Vitals Group     BP 06/20/22 1400 101/63     Pulse Rate 06/20/22 1400 68     Resp 06/20/22 1400 18     Temp 06/20/22 1400 98 F (36.7 C)     Temp Source 06/20/22 1400 Oral     SpO2 06/20/22 1400 98 %     Weight --      Height --      Head Circumference --      Peak Flow --      Pain Score 06/20/22 1405 5     Pain Loc --      Pain Edu? --      Excl. in [place]? --    No data found.  Updated  Vital Signs BP 101/63 (BP Location: Left Arm)   Pulse 68   Temp 98 F (36.7 C) (Oral)   Resp 18   LMP 05/18/2022   SpO2 98%   Visual Acuity Right Eye Distance:   Left Eye Distance:   Bilateral Distance:    Right Eye Near:   Left Eye Near:    Bilateral Near:     Physical Exam Vitals and nursing note reviewed.  Constitutional:      General: She is not in acute distress.    Appearance: Normal appearance. She is not ill-appearing.  HENT:     Head: Normocephalic and atraumatic.  Eyes:     Conjunctiva/sclera: Conjunctivae normal.  Cardiovascular:     Rate and Rhythm: Normal rate.  Pulmonary:     Effort: Pulmonary effort is normal.  Skin:    Comments: Healing abrasions noted to bilateral knees- no active bleeding or drainage, no surrounding erythema, swelling  Neurological:     Mental Status: She is alert.  Psychiatric:        Mood and Affect: Mood normal.        Behavior: Behavior normal.        Thought Content: Thought content normal.      UC Treatments / Results  Labs (all labs ordered are listed, but only abnormal results are displayed) Labs Reviewed  POCT URINALYSIS DIP (MANUAL ENTRY) - Abnormal; Notable for the following components:      Result Value   Color, UA orange (*)    Clarity, UA cloudy (*)    Ketones, POC UA small (15) (*)    Protein Ur, POC trace (*)    All other components within normal limits  HIV ANTIBODY (ROUTINE TESTING W REFLEX)  HEPATITIS PANEL, ACUTE  RPR  POCT URINE PREGNANCY  CERVICOVAGINAL ANCILLARY ONLY    EKG   Radiology No results found.  Procedures Procedures (including critical care time)  Medications Ordered in UC Medications - No data to display  Initial Impression / Assessment and Plan / UC Course  I have reviewed the triage vital signs and the nursing notes.  Pertinent labs & imaging results that were available during my care of the patient were reviewed by me and considered in my medical decision making (see  chart for details).    STD screening ordered as requested.  Recommended she keep abrasions covered and treated with antibiotic ointment.  Encouraged follow-up with any further concerns, signs of infection.  Final Clinical Impressions(s) / UC Diagnoses   Final diagnoses:  Screening for STD (sexually transmitted disease)  Abrasion of knee, bilateral  Discharge Instructions   None    ED Prescriptions   None    PDMP not reviewed this encounter.   Tomi Bamberger, PA-C 06/20/22 1513

## 2022-06-20 NOTE — ED Triage Notes (Signed)
Pt presents for healing wound check on both knees from a fall from a few weeks ago; pt also presents for STD Testing, unsure if she is having symptoms.

## 2022-06-21 LAB — CERVICOVAGINAL ANCILLARY ONLY
Bacterial Vaginitis (gardnerella): POSITIVE — AB
Candida Glabrata: NEGATIVE
Candida Vaginitis: NEGATIVE
Chlamydia: NEGATIVE
Comment: NEGATIVE
Comment: NEGATIVE
Comment: NEGATIVE
Comment: NEGATIVE
Comment: NEGATIVE
Comment: NORMAL
Neisseria Gonorrhea: NEGATIVE
Trichomonas: NEGATIVE

## 2022-06-22 ENCOUNTER — Telehealth (HOSPITAL_COMMUNITY): Payer: Self-pay | Admitting: Emergency Medicine

## 2022-06-22 LAB — HIV ANTIBODY (ROUTINE TESTING W REFLEX): HIV Screen 4th Generation wRfx: NONREACTIVE

## 2022-06-22 LAB — RPR: RPR Ser Ql: NONREACTIVE

## 2022-06-22 MED ORDER — METRONIDAZOLE 500 MG PO TABS: 500.0000 mg | ORAL_TABLET | Freq: Two times a day (BID) | ORAL | 0 refills | Status: DC

## 2022-10-22 ENCOUNTER — Encounter (HOSPITAL_COMMUNITY): Payer: Self-pay | Admitting: Emergency Medicine

## 2022-10-22 ENCOUNTER — Ambulatory Visit (HOSPITAL_COMMUNITY)
Admission: EM | Admit: 2022-10-22 | Discharge: 2022-10-22 | Disposition: A | Discharge status: Home / Self Care | Payer: Medicaid Other | Attending: Family Medicine | Admitting: Family Medicine

## 2022-10-22 DIAGNOSIS — N898 Other specified noninflammatory disorders of vagina: Secondary | ICD-10-CM | POA: Diagnosis not present

## 2022-10-22 MED ORDER — METRONIDAZOLE 500 MG PO TABS: 500.0000 mg | ORAL_TABLET | Freq: Two times a day (BID) | ORAL | 0 refills | Status: DC

## 2022-10-22 NOTE — ED Triage Notes (Signed)
Pt c/o white vaginal discharge that is heavy and has a weird smell since mid Feb. Requesting STD testing.

## 2022-10-22 NOTE — ED Provider Notes (Signed)
[place]    CSN: [id number] Arrival date & time: 10/22/22  0909      History   Chief Complaint Chief Complaint  Patient presents with   Vaginal Discharge    HPI Patricia [name] is a 22 y.o. female.   Patient is here for vaginal d/c x 1 month.  White in color, has an odor.  No itching.  No known exposures to STDs.  She has a BV, and seems typical for that.  No urinary symptoms.   LMP was 5 days ago.  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   Right upper lobe pulmonary infiltrate 01/28/2020   Supraclavicular adenopathy    Positive QuantiFERON-TB Gold test    Weight loss 01/24/2020   Anemia 01/23/2020   Suicide attempt ([place])    Depression 05/24/2015   Overdose 05/23/2015   Suicide attempt by acetaminophen overdose ([place]) 05/23/2015    Past Surgical History:  Procedure Laterality Date   BRONCHIAL BRUSHINGS  01/30/2020   Procedure: BRONCHIAL BRUSHINGS;  Surgeon: Candee Furbish, MD;  Location: [facility] ENDOSCOPY;  Service: Pulmonary;;   BRONCHIAL NEEDLE ASPIRATION BIOPSY  01/30/2020   Procedure: BRONCHIAL NEEDLE ASPIRATION BIOPSIES;  Surgeon: Candee Furbish, MD;  Location: [facility] ENDOSCOPY;  Service: Pulmonary;;   BRONCHIAL WASHINGS  01/30/2020   Procedure: BRONCHIAL WASHINGS;  Surgeon: Candee Furbish, MD;  Location: [facility] ENDOSCOPY;  Service: Pulmonary;;   VIDEO BRONCHOSCOPY WITH ENDOBRONCHIAL ULTRASOUND N/A 01/30/2020   Procedure: VIDEO BRONCHOSCOPY WITH ENDOBRONCHIAL ULTRASOUND;  Surgeon: Candee Furbish, MD;  Location: [facility] ENDOSCOPY;  Service: Pulmonary;  Laterality: N/A;    OB History   No obstetric history on file.      Home Medications    Prior to Admission medications   Medication  Sig Start Date End Date Taking? Authorizing Provider  FLUoxetine (PROZAC) 20 MG capsule Take 1 capsule (20 mg total) by mouth daily. 04/02/20 05/02/20  Gladys Damme, MD  Iron, Ferrous Sulfate, 325 (65 Fe) MG TABS Take 1 tablet by mouth daily. 01/29/22   Gladys Damme, MD  metroNIDAZOLE (FLAGYL) 500 MG tablet Take 1 tablet (500 mg total) by mouth 2 (two) times daily. 06/22/22   Chase Picket, MD  norgestimate-ethinyl estradiol (ORTHO-CYCLEN) 0.25-35 MG-MCG tablet Take 1 tablet by mouth daily. 01/29/22   Gladys Damme, MD  polyethylene glycol powder [facility]) 17 GM/SCOOP powder Take 17 g by mouth 2 (two) times daily as needed. 01/29/22   Gladys Damme, MD    Family History Family History  Problem Relation Age of Onset   Healthy Mother    Healthy Father     Social History Social History   Tobacco Use   Smoking status: Never   Smokeless tobacco: Never  Vaping Use   Vaping Use: Some days  Substance Use Topics   Alcohol use: No   Drug use: No     Allergies   Other   Review of Systems Review of Systems  Constitutional: Negative.   HENT: Negative.    Respiratory: Negative.    Cardiovascular: Negative.   Gastrointestinal: Negative.   Genitourinary:  Positive for vaginal discharge.     Physical Exam Triage Vital Signs ED  Triage Vitals  Enc Vitals Group     BP 10/22/22 1000 101/69     Pulse Rate 10/22/22 1000 72     Resp 10/22/22 1000 16     Temp 10/22/22 1000 98.6 F (37 C)     Temp Source 10/22/22 1000 Oral     SpO2 10/22/22 1000 99 %     Weight --      Height --      Head Circumference --      Peak Flow --      Pain Score 10/22/22 0959 0     Pain Loc --      Pain Edu? --      Excl. in [place]? --    No data found.  Updated Vital Signs BP 101/69 (BP Location: Left Arm)   Pulse 72   Temp 98.6 F (37 C) (Oral)   Resp 16   LMP 10/12/2022   SpO2 99%   Visual Acuity Right Eye Distance:   Left Eye Distance:   Bilateral Distance:     Right Eye Near:   Left Eye Near:    Bilateral Near:     Physical Exam Constitutional:      Appearance: Normal appearance.  Cardiovascular:     Rate and Rhythm: Normal rate.  Pulmonary:     Effort: Pulmonary effort is normal.  Abdominal:     Palpations: Abdomen is soft.     Tenderness: There is no abdominal tenderness. There is no guarding or rebound.  Skin:    General: Skin is warm.  Neurological:     General: No focal deficit present.     Mental Status: She is alert.  Psychiatric:        Mood and Affect: Mood normal.      UC Treatments / Results  Labs (all labs ordered are listed, but only abnormal results are displayed) Labs Reviewed  CERVICOVAGINAL ANCILLARY ONLY    EKG   Radiology No results found.  Procedures Procedures (including critical care time)  Medications Ordered in UC Medications - No data to display  Initial Impression / Assessment and Plan / UC Course  I have reviewed the triage vital signs and the nursing notes.  Pertinent labs & imaging results that were available during my care of the patient were reviewed by me and considered in my medical decision making (see chart for details).  Final Clinical Impressions(s) / UC Diagnoses   Final diagnoses:  Vaginal discharge     Discharge Instructions      You were seen today for vaginal discharge. I have sent out flagyl twice/day x 7 days to treat this.  If the swab shows anything else that needs to be treated we will call and notify you.     ED Prescriptions     Medication Sig Dispense Auth. Provider   metroNIDAZOLE (FLAGYL) 500 MG tablet Take 1 tablet (500 mg total) by mouth 2 (two) times daily. 14 tablet Rondel Oh, MD      PDMP not reviewed this encounter.   Rondel Oh, MD 10/22/22 1027

## 2022-10-22 NOTE — Discharge Instructions (Signed)
You were seen today for vaginal discharge. I have sent out flagyl twice/day x 7 days to treat this.  If the swab shows anything else that needs to be treated we will call and notify you.

## 2022-10-25 LAB — CERVICOVAGINAL ANCILLARY ONLY
Bacterial Vaginitis (gardnerella): POSITIVE — AB
Candida Glabrata: NEGATIVE
Candida Vaginitis: NEGATIVE
Chlamydia: NEGATIVE
Comment: NEGATIVE
Comment: NEGATIVE
Comment: NEGATIVE
Comment: NEGATIVE
Comment: NEGATIVE
Comment: NORMAL
Neisseria Gonorrhea: NEGATIVE
Trichomonas: NEGATIVE

## 2022-12-11 ENCOUNTER — Other Ambulatory Visit: Payer: Self-pay

## 2022-12-11 ENCOUNTER — Encounter (HOSPITAL_BASED_OUTPATIENT_CLINIC_OR_DEPARTMENT_OTHER): Payer: Self-pay | Admitting: Emergency Medicine

## 2022-12-11 ENCOUNTER — Emergency Department (HOSPITAL_BASED_OUTPATIENT_CLINIC_OR_DEPARTMENT_OTHER)
Admission: EM | Admit: 2022-12-11 | Discharge: 2022-12-12 | Disposition: A | Discharge status: Home / Self Care | Payer: Medicaid Other | Attending: Emergency Medicine | Admitting: Emergency Medicine

## 2022-12-11 DIAGNOSIS — R462 Strange and inexplicable behavior: Secondary | ICD-10-CM | POA: Diagnosis not present

## 2022-12-11 DIAGNOSIS — T50905A Adverse effect of unspecified drugs, medicaments and biological substances, initial encounter: Secondary | ICD-10-CM | POA: Diagnosis not present

## 2022-12-11 DIAGNOSIS — T40715A Adverse effect of cannabis, initial encounter: Secondary | ICD-10-CM | POA: Diagnosis not present
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
## 2022-12-12 LAB — RAPID URINE DRUG SCREEN, HOSP PERFORMED
Amphetamines: NOT DETECTED
Barbiturates: NOT DETECTED
Benzodiazepines: NOT DETECTED
Cocaine: NOT DETECTED
Opiates: NOT DETECTED
Tetrahydrocannabinol: POSITIVE — AB
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
## 2022-12-13 LAB — GC/CHLAMYDIA PROBE AMP (~~LOC~~) NOT AT ARMC
Chlamydia: NEGATIVE
Comment: NEGATIVE
Comment: NORMAL
Neisseria Gonorrhea: NEGATIVE

## 2022-12-18 ENCOUNTER — Other Ambulatory Visit: Payer: Self-pay

## 2022-12-18 ENCOUNTER — Encounter (HOSPITAL_COMMUNITY): Payer: Self-pay | Admitting: *Deleted

## 2022-12-18 ENCOUNTER — Ambulatory Visit (HOSPITAL_COMMUNITY)
Admission: EM | Admit: 2022-12-18 | Discharge: 2022-12-18 | Disposition: A | Discharge status: Home / Self Care | Payer: Medicaid Other | Attending: Emergency Medicine | Admitting: Emergency Medicine

## 2022-12-18 ENCOUNTER — Ambulatory Visit (INDEPENDENT_AMBULATORY_CARE_PROVIDER_SITE_OTHER): Payer: Medicaid Other

## 2022-12-18 DIAGNOSIS — M25531 Pain in right wrist: Secondary | ICD-10-CM | POA: Diagnosis not present

## 2022-12-18 DIAGNOSIS — S6991XA Unspecified injury of right wrist, hand and finger(s), initial encounter: Secondary | ICD-10-CM | POA: Diagnosis not present
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
## 2022-12-18 NOTE — Discharge Instructions (Addendum)
X-ray is negative for injury to the bone and pain should improve with time  May use ibuprofen every 8 hours as needed to help with pain, may use Tylenol in addition to this  May use ice and heat in 10-minute intervals for comfort, whichever feels best  May state arm on pillow when sitting and lying for additional comfort and support  If your wrist pain persist past 2 weeks please follow-up with orthopedics for reevaluation

## 2023-01-19 ENCOUNTER — Other Ambulatory Visit: Payer: Self-pay

## 2023-01-19 ENCOUNTER — Emergency Department (HOSPITAL_COMMUNITY)
Admission: EM | Admit: 2023-01-19 | Discharge: 2023-01-19 | Discharge status: Against Medical Advice | Payer: Medicaid Other | Attending: Emergency Medicine | Admitting: Emergency Medicine

## 2023-01-19 ENCOUNTER — Emergency Department (HOSPITAL_BASED_OUTPATIENT_CLINIC_OR_DEPARTMENT_OTHER)
Admission: EM | Admit: 2023-01-19 | Discharge: 2023-01-19 | Disposition: A | Discharge status: Home / Self Care | Payer: Medicaid Other | Source: Home / Self Care | Attending: Emergency Medicine | Admitting: Emergency Medicine

## 2023-01-19 ENCOUNTER — Encounter (HOSPITAL_COMMUNITY): Payer: Self-pay | Admitting: Emergency Medicine

## 2023-01-19 DIAGNOSIS — S30810A Abrasion of lower back and pelvis, initial encounter: Secondary | ICD-10-CM | POA: Insufficient documentation

## 2023-01-19 DIAGNOSIS — Z23 Encounter for immunization: Secondary | ICD-10-CM | POA: Insufficient documentation

## 2023-01-19 DIAGNOSIS — W228XXA Striking against or struck by other objects, initial encounter: Secondary | ICD-10-CM | POA: Insufficient documentation

## 2023-01-19 DIAGNOSIS — S20419A Abrasion of unspecified back wall of thorax, initial encounter: Secondary | ICD-10-CM | POA: Insufficient documentation

## 2023-01-19 DIAGNOSIS — Z5321 Procedure and treatment not carried out due to patient leaving prior to being seen by health care provider: Secondary | ICD-10-CM | POA: Insufficient documentation

## 2023-01-19 DIAGNOSIS — T148XXA Other injury of unspecified body region, initial encounter: Secondary | ICD-10-CM

## 2023-01-19 MED ORDER — TETANUS-DIPHTH-ACELL PERTUSSIS 5-2.5-18.5 LF-MCG/0.5 IM SUSY
0.5000 mL | PREFILLED_SYRINGE | Freq: Once | INTRAMUSCULAR | Status: AC
  Administered 2023-01-19: 0.5 mL via INTRAMUSCULAR
  Filled 2023-01-19: qty 0.5

## 2023-01-19 MED ORDER — IBUPROFEN 400 MG PO TABS
400.0000 mg | ORAL_TABLET | Freq: Once | ORAL | Status: AC
  Administered 2023-01-19: 400 mg via ORAL
  Filled 2023-01-19: qty 1

## 2023-01-19 MED ORDER — BACITRACIN ZINC 500 UNIT/GM EX OINT
TOPICAL_OINTMENT | Freq: Once | CUTANEOUS | Status: AC
  Administered 2023-01-19: 31.5 via TOPICAL
  Filled 2023-01-19: qty 28.35
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
## 2023-01-19 NOTE — ED Notes (Signed)
Pt stated that she was leaving can't wait any more.

## 2023-01-19 NOTE — ED Notes (Signed)
Disinfected abrasion on pt back and placed covering to prevent irritation from her clothes as pt states it was bothering her.

## 2023-01-19 NOTE — Discharge Instructions (Signed)
Apply antibiotic ointment such as Neosporin, bacitracin or other generic antibiotic ointment to the abrasion daily.  Take over-the-counter medications such as Tylenol or ibuprofen.  Keep a bandage over the wound and change it daily.  Take
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
## 2023-01-21 ENCOUNTER — Encounter (HOSPITAL_COMMUNITY): Payer: Self-pay | Admitting: Emergency Medicine

## 2023-01-21 ENCOUNTER — Other Ambulatory Visit: Payer: Self-pay

## 2023-01-21 ENCOUNTER — Emergency Department (EMERGENCY_DEPARTMENT_HOSPITAL)
Admission: EM | Admit: 2023-01-21 | Discharge: 2023-01-21 | Disposition: A | Discharge status: Home / Self Care | Payer: Medicaid Other | Source: Home / Self Care | Attending: Emergency Medicine | Admitting: Emergency Medicine

## 2023-01-21 DIAGNOSIS — Z008 Encounter for other general examination: Secondary | ICD-10-CM

## 2023-01-21 DIAGNOSIS — F32A Depression, unspecified: Secondary | ICD-10-CM | POA: Insufficient documentation

## 2023-01-21 DIAGNOSIS — R9431 Abnormal electrocardiogram [ECG] [EKG]: Secondary | ICD-10-CM | POA: Diagnosis not present

## 2023-01-21 DIAGNOSIS — R45851 Suicidal ideations: Secondary | ICD-10-CM | POA: Diagnosis not present

## 2023-01-21 DIAGNOSIS — F4323 Adjustment disorder with mixed anxiety and depressed mood: Secondary | ICD-10-CM | POA: Insufficient documentation

## 2023-01-21 DIAGNOSIS — Z79899 Other long term (current) drug therapy: Secondary | ICD-10-CM | POA: Insufficient documentation

## 2023-01-21 DIAGNOSIS — Y906 Blood alcohol level of 120-199 mg/100 ml: Secondary | ICD-10-CM | POA: Insufficient documentation

## 2023-01-21 LAB — URINALYSIS, ROUTINE W REFLEX MICROSCOPIC
Bilirubin Urine: NEGATIVE
Glucose, UA: NEGATIVE mg/dL
Hgb urine dipstick: NEGATIVE
Ketones, ur: NEGATIVE mg/dL
Leukocytes,Ua: NEGATIVE
Nitrite: NEGATIVE
Protein, ur: NEGATIVE mg/dL
Specific Gravity, Urine: 1.01 (ref 1.005–1.030)
pH: 5.5 (ref 5.0–8.0)

## 2023-01-21 LAB — CBC WITH DIFFERENTIAL/PLATELET
Abs Immature Granulocytes: 0.02 10*3/uL (ref 0.00–0.07)
Basophils Absolute: 0.1 10*3/uL (ref 0.0–0.1)
Basophils Relative: 1 %
Eosinophils Absolute: 0 10*3/uL (ref 0.0–0.5)
Eosinophils Relative: 1 %
HCT: 34 % — ABNORMAL LOW (ref 36.0–46.0)
Hemoglobin: 10.4 g/dL — ABNORMAL LOW (ref 12.0–15.0)
Immature Granulocytes: 0 %
Lymphocytes Relative: 36 %
Lymphs Abs: 2.2 10*3/uL (ref 0.7–4.0)
MCH: 26.9 pg (ref 26.0–34.0)
MCHC: 30.6 g/dL (ref 30.0–36.0)
MCV: 88.1 fL (ref 80.0–100.0)
Monocytes Absolute: 0.3 10*3/uL (ref 0.1–1.0)
Monocytes Relative: 4 %
Neutro Abs: 3.6 10*3/uL (ref 1.7–7.7)
Neutrophils Relative %: 58 %
Platelets: 347 10*3/uL (ref 150–400)
RBC: 3.86 MIL/uL — ABNORMAL LOW (ref 3.87–5.11)
RDW: 15.6 % — ABNORMAL HIGH (ref 11.5–15.5)
WBC: 6.1 10*3/uL (ref 4.0–10.5)
nRBC: 0 % (ref 0.0–0.2)

## 2023-01-21 LAB — I-STAT BETA HCG BLOOD, ED (MC, WL, AP ONLY): I-stat hCG, quantitative: <5 m[IU]/mL (ref <5)

## 2023-01-21 LAB — COMPREHENSIVE METABOLIC PANEL
ALT: 18 U/L (ref 0–44)
AST: 21 U/L (ref 15–41)
Albumin: 3.7 g/dL (ref 3.5–5.0)
Alkaline Phosphatase: 65 U/L (ref 38–126)
Anion gap: 12 (ref 5–15)
BUN: 5 mg/dL — ABNORMAL LOW (ref 6–20)
CO2: 19 mmol/L — ABNORMAL LOW (ref 22–32)
Calcium: 8.4 mg/dL — ABNORMAL LOW (ref 8.9–10.3)
Chloride: 108 mmol/L (ref 98–111)
Creatinine, Ser: 0.74 mg/dL (ref 0.44–1.00)
GFR, Estimated: >60 mL/min (ref >60)
Glucose, Bld: 81 mg/dL (ref 70–99)
Potassium: 3.6 mmol/L (ref 3.5–5.1)
Sodium: 139 mmol/L (ref 135–145)
Total Bilirubin: 0.3 mg/dL (ref 0.3–1.2)
Total Protein: 7.1 g/dL (ref 6.5–8.1)

## 2023-01-21 LAB — RAPID URINE DRUG SCREEN, HOSP PERFORMED
Amphetamines: NOT DETECTED
Barbiturates: NOT DETECTED
Benzodiazepines: NOT DETECTED
Cocaine: NOT DETECTED
Opiates: NOT DETECTED
Tetrahydrocannabinol: POSITIVE — AB

## 2023-01-21 LAB — ETHANOL: Alcohol, Ethyl (B): 195 mg/dL — ABNORMAL HIGH (ref <10)

## 2023-01-21 MED ORDER — BACITRACIN ZINC 500 UNIT/GM EX OINT: TOPICAL_OINTMENT | Freq: Once | CUTANEOUS | Status: DC
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
## 2023-01-21 NOTE — ED Triage Notes (Signed)
Patient endorses SI, denies HI.  Patient denies plan.
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
## 2023-01-21 NOTE — Discharge Instructions (Addendum)
It was a pleasure taking care of you today!   Your labs didn't show any concerning emergent findings today. Attached is information for Behavioral Health Urgent Care, you may follow-up as needed regarding today's ED visit.  Attached is also resources for behavioral health concerns.  Return to the emergency department for experience increasing/worsening symptoms.

## 2023-01-21 NOTE — Consult Note (Incomplete)
Telepsych Consultation   Reason for Consult:  *** Referring Physician:  *** Location of Patient:  Location of Provider: { [facility] Provider Location:[id number]}  Patient Identification: Patricia [name] MRN:  [id number] Principal Diagnosis: <principal problem not specified> Diagnosis:  Active Problems:   * No active hospital problems. *   Total Time spent with patient: {Time; 15 min - 8 hours:17441}  Subjective:   Patricia [name] is a 22 y.o. female patient admitted with ***.  HPI:   Patricia [name], 22 y.o., female patient presented to ***.  Patient seen via telepsych by this provider; chart reviewed and consulted with Dr. Lucianne Muss on 01/21/23.  On evaluation Patricia [name] is seen sitting on a chair in exam room.  Pt greeted and anticipatory guidance given.  She is alert and oriented x4; appears tired, prior to assessment is prompted by nursing to remain awake to participate. She is fairly groomed, and wearing street clothes.  She makes minimal eye contact with this Clinical research associate.  When asked about events that led to current hospitalization she is very guarded and speaks in a low tone.  She reports she came to the emergency department for "behavioral health and I couldn't just walk over there."  She    She reports she only drinks when she goes out.  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  She reports a hx for depression ,was prescribed antidepressant, fluoxetine in 2022, not sure if it helped her and she stopped taking  Prior suicide attempt via tylenol and ibuprofen overdose, and was hospitalized for about one week.    She lives at home with her mother and 3 brothers, she is the middle child.  She reports her relationship with mom is messed up and she does not want to talk to her.    Labs: BAL was 195 on admission UDS + for THC During evaluation Patricia [name] is ***(position); ***he/she is alert/oriented x 4; calm/cooperative; and mood congruent with affect.  Patient is  speaking in a clear tone at moderate volume, and normal pace; with good eye contact.  ***His/Her thought process is coherent and relevant; There is no indication that ***he/she is currently responding to internal/external stimuli or experiencing delusional thought content.  Patient denies suicidal/self-harm/homicidal ideation, psychosis, and paranoia.  Patient has remained calm throughout assessment and has answered questions appropriately.   Per     Per ED Provider Admission Assessment  Chief Complaint  Patient presents with  . Psychiatric Evaluation      Patricia [name] is a 22 y.o. female with a past medical history of depression, who presents emergency department with concerns for SI onset.  Denies HI, auditory/visual hallucinations.  Denies plan at this time.  She used to take medications for depression, however, doesn't take any medications at this time. She is not being seen by a therapist at this time.       Spoke with Dr. ***; informed of above recommendation and disposition   Past Psychiatric History: ***  Risk to Self:   Risk to Others:   Prior Inpatient Therapy:   Prior Outpatient Therapy:    Past Medical History:  Past Medical History:  Diagnosis Date  . Depression   . Suicide attempt by drug ingestion [facility])     Past Surgical History:  Procedure Laterality Date  . BRONCHIAL BRUSHINGS  01/30/2020   Procedure: BRONCHIAL BRUSHINGS;  Surgeon: Lorin Glass, MD;  Location: [facility] ENDOSCOPY;  Service: Pulmonary;;  . BRONCHIAL NEEDLE  ASPIRATION BIOPSY  01/30/2020   Procedure: BRONCHIAL NEEDLE ASPIRATION BIOPSIES;  Surgeon: Lorin Glass, MD;  Location: [facility] ENDOSCOPY;  Service: Pulmonary;;  . BRONCHIAL WASHINGS  01/30/2020   Procedure: BRONCHIAL WASHINGS;  Surgeon: Lorin Glass, MD;  Location: [facility] ENDOSCOPY;  Service: Pulmonary;;  . VIDEO BRONCHOSCOPY WITH ENDOBRONCHIAL ULTRASOUND N/A 01/30/2020   Procedure: VIDEO BRONCHOSCOPY WITH ENDOBRONCHIAL ULTRASOUND;  Surgeon: Lorin Glass, MD;  Location: [facility] ENDOSCOPY;  Service: Pulmonary;  Laterality: N/A;   Family History:  Family History  Problem Relation Age of Onset  . Healthy Mother   . Healthy Father    Family Psychiatric  History: *** Social History:  Social History   Substance and Sexual Activity  Alcohol Use No     Social History   Substance and Sexual Activity  Drug Use Yes  . Types: Marijuana    Social History   Socioeconomic History  . Marital status: Single    Spouse name: Not on file  . Number of children: Not on file  . Years of education: Not on file  . Highest education level: Not on file  Occupational History  . Not on file  Tobacco Use  . Smoking status: Never  . Smokeless tobacco: Never  Vaping Use  . Vaping Use: Some days  Substance and Sexual Activity  . Alcohol use: No  . Drug use: Yes    Types: Marijuana  . Sexual activity: Never    Birth control/protection: None  Other Topics Concern  . Not on file  Social History Narrative  . Not on file   Social Determinants of Health   Financial Resource Strain: Not on file  Food Insecurity: Not on file  Transportation Needs: Not on file  Physical Activity: Not on file  Stress: Not on file  Social Connections: Not on file   Additional Social History:    Allergies:   Allergies  Allergen Reactions  . Other Other (See Comments)    Patient received a Pfizer-brand Covid vaccine and felt tachy two hours later, came to the ED    Labs:  Results for orders placed or performed during the hospital encounter of 01/21/23 (from the past 48 hour(s))  I-Stat beta hCG blood, ED     Status: None   Collection Time: 01/21/23  6:55 AM  Result Value Ref Range   I-stat hCG, quantitative <5.0 <5 mIU/mL   Comment 3            Comment:   GEST. AGE      CONC.  (mIU/mL)   <=1 WEEK        5 - 50     2 WEEKS       50 - 500     3 WEEKS       100 - 10,000     4 WEEKS     1,000 - 30,000        FEMALE AND NON-PREGNANT FEMALE:     LESS  THAN 5 mIU/mL   Comprehensive metabolic panel     Status: Abnormal   Collection Time: 01/21/23  6:58 AM  Result Value Ref Range   Sodium 139 135 - 145 mmol/L   Potassium 3.6 3.5 - 5.1 mmol/L   Chloride 108 98 - 111 mmol/L   CO2 19 (L) 22 - 32 mmol/L   Glucose, Bld 81 70 - 99 mg/dL    Comment: Glucose reference range applies only to samples taken after fasting for at least 8 hours.  BUN 5 (L) 6 - 20 mg/dL   Creatinine, Ser 9.52 0.44 - 1.00 mg/dL   Calcium 8.4 (L) 8.9 - 10.3 mg/dL   Total Protein 7.1 6.5 - 8.1 g/dL   Albumin 3.7 3.5 - 5.0 g/dL   AST 21 15 - 41 U/L   ALT 18 0 - 44 U/L   Alkaline Phosphatase 65 38 - 126 U/L   Total Bilirubin 0.3 0.3 - 1.2 mg/dL   GFR, Estimated >84 >13 mL/min    Comment: (NOTE) Calculated using the CKD-EPI Creatinine Equation (2021)    Anion gap 12 5 - 15    Comment: Performed at [facility], [address], [place], [place] [address]  Ethanol     Status: Abnormal   Collection Time: 01/21/23  6:58 AM  Result Value Ref Range   Alcohol, Ethyl (B) 195 (H) <10 mg/dL    Comment: (NOTE) Lowest detectable limit for serum alcohol is 10 mg/dL.  For medical purposes only. Performed at [facility], [address], [place] , [place] [address]   CBC with Diff     Status: Abnormal   Collection Time: 01/21/23  6:58 AM  Result Value Ref Range   WBC 6.1 4.0 - 10.5 K/uL   RBC 3.86 (L) 3.87 - 5.11 MIL/uL   Hemoglobin 10.4 (L) 12.0 - 15.0 g/dL   HCT 36.6 (L) 44.0 - 34.7 %   MCV 88.1 80.0 - 100.0 fL   MCH 26.9 26.0 - 34.0 pg   MCHC 30.6 30.0 - 36.0 g/dL   RDW 42.5 (H) 95.6 - 38.7 %   Platelets 347 150 - 400 K/uL   nRBC 0.0 0.0 - 0.2 %   Neutrophils Relative % 58 %   Neutro Abs 3.6 1.7 - 7.7 K/uL   Lymphocytes Relative 36 %   Lymphs Abs 2.2 0.7 - 4.0 K/uL   Monocytes Relative 4 %   Monocytes Absolute 0.3 0.1 - 1.0 K/uL   Eosinophils Relative 1 %   Eosinophils Absolute 0.0 0.0 - 0.5 K/uL   Basophils Relative 1 %   Basophils Absolute 0.1  0.0 - 0.1 K/uL   Immature Granulocytes 0 %   Abs Immature Granulocytes 0.02 0.00 - 0.07 K/uL    Comment: Performed at [facility], [address] [place], [place] [address]  Urine rapid drug screen (hosp performed)     Status: Abnormal   Collection Time: 01/21/23  7:00 AM  Result Value Ref Range   Opiates NONE DETECTED NONE DETECTED   Cocaine NONE DETECTED NONE DETECTED   Benzodiazepines NONE DETECTED NONE DETECTED   Amphetamines NONE DETECTED NONE DETECTED   Tetrahydrocannabinol POSITIVE (A) NONE DETECTED   Barbiturates NONE DETECTED NONE DETECTED    Comment: (NOTE) DRUG SCREEN FOR MEDICAL PURPOSES ONLY.  IF CONFIRMATION IS NEEDED FOR ANY PURPOSE, NOTIFY LAB WITHIN 5 DAYS.  LOWEST DETECTABLE LIMITS FOR URINE DRUG SCREEN Drug Class                     Cutoff (ng/mL) Amphetamine and metabolites    1000 Barbiturate and metabolites    200 Benzodiazepine                 200 Opiates and metabolites        300 Cocaine and metabolites        300 THC  50 Performed at [facility], [address], [place], [place] [address]   Urinalysis, Routine w reflex microscopic -Urine, Clean Catch     Status: None   Collection Time: 01/21/23  7:00 AM  Result Value Ref Range   Color, Urine YELLOW YELLOW   APPearance CLEAR CLEAR   Specific Gravity, Urine 1.010 1.005 - 1.030   pH 5.5 5.0 - 8.0   Glucose, UA NEGATIVE NEGATIVE mg/dL   Hgb urine dipstick NEGATIVE NEGATIVE   Bilirubin Urine NEGATIVE NEGATIVE   Ketones, ur NEGATIVE NEGATIVE mg/dL   Protein, ur NEGATIVE NEGATIVE mg/dL   Nitrite NEGATIVE NEGATIVE   Leukocytes,Ua NEGATIVE NEGATIVE    Comment: Microscopic not done on urines with negative protein, blood, leukocytes, nitrite, or glucose < 500 mg/dL. Performed at [facility], [address], [place], [place] [address]     Medications:  Current Facility-Administered Medications  Medication Dose Route Frequency Provider Last Rate Last  Admin  . bacitracin ointment   Topical Once Blue, Soijett A, PA-C       Current Outpatient Medications  Medication Sig Dispense Refill  . norgestimate-ethinyl estradiol (ORTHO-CYCLEN) 0.25-35 MG-MCG tablet Take 1 tablet by mouth daily. 28 tablet 11    Musculoskeletal: Strength & Muscle Tone: {desc; muscle tone:32375} Gait & Station: {PE GAIT ED UJWJ:19147} Patient leans: {Patient Leans:21022755}          Psychiatric Specialty Exam:  Presentation  General Appearance: No data recorded Eye Contact:No data recorded Speech:No data recorded Speech Volume:No data recorded Handedness:No data recorded  Mood and Affect  Mood:No data recorded Affect:No data recorded  Thought Process  Thought Processes:No data recorded Descriptions of Associations:No data recorded Orientation:No data recorded Thought Content:No data recorded History of Schizophrenia/Schizoaffective disorder:No data recorded Duration of Psychotic Symptoms:No data recorded Hallucinations:No data recorded Ideas of Reference:No data recorded Suicidal Thoughts:No data recorded Homicidal Thoughts:No data recorded  Sensorium  Memory:No data recorded Judgment:No data recorded Insight:No data recorded  Executive Functions  Concentration:No data recorded Attention Span:No data recorded Recall:No data recorded Fund of Knowledge:No data recorded Language:No data recorded  Psychomotor Activity  Psychomotor Activity:No data recorded  Assets  Assets:No data recorded  Sleep  Sleep:No data recorded   Physical Exam: Physical Exam ROS Blood pressure 117/87, pulse 75, temperature 98.4 F (36.9 C), temperature source Oral, resp. rate 18, height 5\' 2"  (1.575 m), weight 60 kg, SpO2 100 %. Body mass index is 24.19 kg/m.  Treatment Plan Summary: Pt with passive suicidal ideation with no plan or intent for self harm. She is guarded regarding the events that led to her presenting for help. She appears  intoxicated, bal was 195 on arrival at 0658 today. She is coherent and cooperative but does appear intoxicated, thus difficult to fully assess her today.  I spoke with Dr. Alfonse Flavors at [facility] who agrees to accept patient for overnight observation and AM reassessment.  Believe with rest, she may be appropriate for discharge with outpatient resoures. Plan is for patient to go to [facility] for overnight obs and am reassessment.   Disposition: {[facility] Consult Plan:20772}  This service was provided via telemedicine using a 2-way, interactive audio and video technology.  Names of all persons participating in this telemedicine service and their role in this encounter. Name: *** Role: ***  Name: *** Role: ***  Name: *** Role: ***  Name: *** Role: ***    Chales Abrahams, NP 01/21/2023 10:14 AM

## 2023-01-22 ENCOUNTER — Emergency Department (HOSPITAL_COMMUNITY): Payer: Medicaid Other | Admitting: Certified Registered Nurse Anesthetist

## 2023-01-22 ENCOUNTER — Other Ambulatory Visit: Payer: Self-pay

## 2023-01-22 ENCOUNTER — Encounter (HOSPITAL_COMMUNITY): Admission: EM | Disposition: A | Discharge status: Rehabilitation Facility | Payer: Self-pay | Source: Home / Self Care

## 2023-01-22 ENCOUNTER — Emergency Department (HOSPITAL_COMMUNITY): Payer: Medicaid Other

## 2023-01-22 ENCOUNTER — Inpatient Hospital Stay (HOSPITAL_COMMUNITY): Payer: Medicaid Other

## 2023-01-22 ENCOUNTER — Inpatient Hospital Stay (HOSPITAL_COMMUNITY)
Admission: EM | Admit: 2023-01-22 | Discharge: 2023-01-28 | DRG: 481 | Disposition: A | Discharge status: Rehabilitation Facility | Payer: Medicaid Other | Attending: Surgery | Admitting: Surgery

## 2023-01-22 ENCOUNTER — Encounter (HOSPITAL_COMMUNITY): Payer: Self-pay | Admitting: Emergency Medicine

## 2023-01-22 DIAGNOSIS — S332XXA Dislocation of sacroiliac and sacrococcygeal joint, initial encounter: Secondary | ICD-10-CM | POA: Diagnosis not present

## 2023-01-22 DIAGNOSIS — S32491S Other specified fracture of right acetabulum, sequela: Secondary | ICD-10-CM | POA: Diagnosis not present

## 2023-01-22 DIAGNOSIS — M25561 Pain in right knee: Secondary | ICD-10-CM | POA: Diagnosis not present

## 2023-01-22 DIAGNOSIS — Z8611 Personal history of tuberculosis: Secondary | ICD-10-CM | POA: Diagnosis not present

## 2023-01-22 DIAGNOSIS — F332 Major depressive disorder, recurrent severe without psychotic features: Secondary | ICD-10-CM

## 2023-01-22 DIAGNOSIS — W448XXA Other foreign body entering into or through a natural orifice, initial encounter: Secondary | ICD-10-CM | POA: Diagnosis not present

## 2023-01-22 DIAGNOSIS — S72001A Fracture of unspecified part of neck of right femur, initial encounter for closed fracture: Secondary | ICD-10-CM | POA: Diagnosis not present

## 2023-01-22 DIAGNOSIS — N898 Other specified noninflammatory disorders of vagina: Secondary | ICD-10-CM | POA: Diagnosis not present

## 2023-01-22 DIAGNOSIS — S32441A Displaced fracture of posterior column [ilioischial] of right acetabulum, initial encounter for closed fracture: Secondary | ICD-10-CM | POA: Diagnosis not present

## 2023-01-22 DIAGNOSIS — Z043 Encounter for examination and observation following other accident: Secondary | ICD-10-CM | POA: Diagnosis not present

## 2023-01-22 DIAGNOSIS — Z4789 Encounter for other orthopedic aftercare: Secondary | ICD-10-CM | POA: Diagnosis not present

## 2023-01-22 DIAGNOSIS — Y906 Blood alcohol level of 120-199 mg/100 ml: Secondary | ICD-10-CM | POA: Diagnosis present

## 2023-01-22 DIAGNOSIS — X810XXA Intentional self-harm by jumping or lying in front of motor vehicle, initial encounter: Secondary | ICD-10-CM | POA: Diagnosis present

## 2023-01-22 DIAGNOSIS — Z923 Personal history of irradiation: Secondary | ICD-10-CM

## 2023-01-22 DIAGNOSIS — F1010 Alcohol abuse, uncomplicated: Secondary | ICD-10-CM | POA: Diagnosis not present

## 2023-01-22 DIAGNOSIS — S73004A Unspecified dislocation of right hip, initial encounter: Secondary | ICD-10-CM | POA: Diagnosis not present

## 2023-01-22 DIAGNOSIS — N760 Acute vaginitis: Secondary | ICD-10-CM | POA: Diagnosis not present

## 2023-01-22 DIAGNOSIS — R45851 Suicidal ideations: Secondary | ICD-10-CM | POA: Diagnosis present

## 2023-01-22 DIAGNOSIS — S32491D Other specified fracture of right acetabulum, subsequent encounter for fracture with routine healing: Secondary | ICD-10-CM | POA: Diagnosis not present

## 2023-01-22 DIAGNOSIS — S32411A Displaced fracture of anterior wall of right acetabulum, initial encounter for closed fracture: Secondary | ICD-10-CM | POA: Diagnosis not present

## 2023-01-22 DIAGNOSIS — W44F9XA Other object of natural or organic material, entering into or through a natural orifice, initial encounter: Secondary | ICD-10-CM | POA: Diagnosis present

## 2023-01-22 DIAGNOSIS — S73014A Posterior dislocation of right hip, initial encounter: Secondary | ICD-10-CM | POA: Diagnosis present

## 2023-01-22 DIAGNOSIS — S32409A Unspecified fracture of unspecified acetabulum, initial encounter for closed fracture: Secondary | ICD-10-CM | POA: Diagnosis not present

## 2023-01-22 DIAGNOSIS — Z9151 Personal history of suicidal behavior: Secondary | ICD-10-CM | POA: Diagnosis not present

## 2023-01-22 DIAGNOSIS — S32431A Displaced fracture of anterior column [iliopubic] of right acetabulum, initial encounter for closed fracture: Secondary | ICD-10-CM | POA: Diagnosis present

## 2023-01-22 DIAGNOSIS — S32401A Unspecified fracture of right acetabulum, initial encounter for closed fracture: Secondary | ICD-10-CM

## 2023-01-22 DIAGNOSIS — F3341 Major depressive disorder, recurrent, in partial remission: Secondary | ICD-10-CM | POA: Diagnosis not present

## 2023-01-22 DIAGNOSIS — S32810A Multiple fractures of pelvis with stable disruption of pelvic ring, initial encounter for closed fracture: Principal | ICD-10-CM | POA: Diagnosis present

## 2023-01-22 DIAGNOSIS — T192XXA Foreign body in vulva and vagina, initial encounter: Secondary | ICD-10-CM | POA: Diagnosis present

## 2023-01-22 DIAGNOSIS — S3282XA Multiple fractures of pelvis without disruption of pelvic ring, initial encounter for closed fracture: Secondary | ICD-10-CM | POA: Diagnosis not present

## 2023-01-22 DIAGNOSIS — T1490XA Injury, unspecified, initial encounter: Secondary | ICD-10-CM | POA: Diagnosis not present

## 2023-01-22 DIAGNOSIS — B9689 Other specified bacterial agents as the cause of diseases classified elsewhere: Secondary | ICD-10-CM | POA: Diagnosis present

## 2023-01-22 DIAGNOSIS — A749 Chlamydial infection, unspecified: Secondary | ICD-10-CM | POA: Diagnosis present

## 2023-01-22 DIAGNOSIS — S3289XA Fracture of other parts of pelvis, initial encounter for closed fracture: Secondary | ICD-10-CM | POA: Diagnosis not present

## 2023-01-22 DIAGNOSIS — S32401S Unspecified fracture of right acetabulum, sequela: Secondary | ICD-10-CM | POA: Diagnosis not present

## 2023-01-22 DIAGNOSIS — F4323 Adjustment disorder with mixed anxiety and depressed mood: Secondary | ICD-10-CM | POA: Diagnosis present

## 2023-01-22 DIAGNOSIS — Z041 Encounter for examination and observation following transport accident: Secondary | ICD-10-CM | POA: Diagnosis not present

## 2023-01-22 DIAGNOSIS — S80811A Abrasion, right lower leg, initial encounter: Secondary | ICD-10-CM | POA: Diagnosis not present

## 2023-01-22 DIAGNOSIS — E559 Vitamin D deficiency, unspecified: Secondary | ICD-10-CM | POA: Diagnosis not present

## 2023-01-22 DIAGNOSIS — D62 Acute posthemorrhagic anemia: Secondary | ICD-10-CM | POA: Diagnosis not present

## 2023-01-22 DIAGNOSIS — R9431 Abnormal electrocardiogram [ECG] [EKG]: Secondary | ICD-10-CM | POA: Diagnosis not present

## 2023-01-22 DIAGNOSIS — S32401D Unspecified fracture of right acetabulum, subsequent encounter for fracture with routine healing: Secondary | ICD-10-CM | POA: Diagnosis not present

## 2023-01-22 DIAGNOSIS — R531 Weakness: Secondary | ICD-10-CM | POA: Diagnosis not present

## 2023-01-22 DIAGNOSIS — F10129 Alcohol abuse with intoxication, unspecified: Secondary | ICD-10-CM | POA: Diagnosis present

## 2023-01-22 DIAGNOSIS — S72001S Fracture of unspecified part of neck of right femur, sequela: Secondary | ICD-10-CM | POA: Diagnosis not present

## 2023-01-22 HISTORY — PX: HIP CLOSED REDUCTION: SHX983

## 2023-01-22 HISTORY — DX: Anemia, unspecified: D64.9

## 2023-01-22 HISTORY — PX: CLOSED REDUCTION FINGER WITH PERCUTANEOUS PINNING: SHX5612

## 2023-01-22 HISTORY — PX: INSERTION OF TRACTION PIN: SHX6560

## 2023-01-22 LAB — CBC
HCT: 37.7 % (ref 36.0–46.0)
HCT: 33.8 % — ABNORMAL LOW (ref 36.0–46.0)
Hemoglobin: 11.3 g/dL — ABNORMAL LOW (ref 12.0–15.0)
Hemoglobin: 10.2 g/dL — ABNORMAL LOW (ref 12.0–15.0)
MCH: 27.8 pg (ref 26.0–34.0)
MCH: 26.8 pg (ref 26.0–34.0)
MCHC: 30 g/dL (ref 30.0–36.0)
MCHC: 30.2 g/dL (ref 30.0–36.0)
MCV: 92.6 fL (ref 80.0–100.0)
MCV: 88.7 fL (ref 80.0–100.0)
Platelets: 408 10*3/uL — ABNORMAL HIGH (ref 150–400)
Platelets: 295 10*3/uL (ref 150–400)
RBC: 4.07 MIL/uL (ref 3.87–5.11)
RBC: 3.81 MIL/uL — ABNORMAL LOW (ref 3.87–5.11)
RDW: 15.9 % — ABNORMAL HIGH (ref 11.5–15.5)
RDW: 16 % — ABNORMAL HIGH (ref 11.5–15.5)
WBC: 11.6 10*3/uL — ABNORMAL HIGH (ref 4.0–10.5)
WBC: 12.1 10*3/uL — ABNORMAL HIGH (ref 4.0–10.5)
nRBC: 0 % (ref 0.0–0.2)
nRBC: 0 % (ref 0.0–0.2)

## 2023-01-22 LAB — COMPREHENSIVE METABOLIC PANEL
ALT: 19 U/L (ref 0–44)
AST: 32 U/L (ref 15–41)
Albumin: 4 g/dL (ref 3.5–5.0)
Alkaline Phosphatase: 77 U/L (ref 38–126)
Anion gap: 18 — ABNORMAL HIGH (ref 5–15)
BUN: 6 mg/dL (ref 6–20)
CO2: 17 mmol/L — ABNORMAL LOW (ref 22–32)
Calcium: 8.9 mg/dL (ref 8.9–10.3)
Chloride: 106 mmol/L (ref 98–111)
Creatinine, Ser: 0.97 mg/dL (ref 0.44–1.00)
GFR, Estimated: >60 mL/min (ref >60)
Glucose, Bld: 84 mg/dL (ref 70–99)
Potassium: 3.2 mmol/L — ABNORMAL LOW (ref 3.5–5.1)
Sodium: 141 mmol/L (ref 135–145)
Total Bilirubin: 0.3 mg/dL (ref 0.3–1.2)
Total Protein: 7.7 g/dL (ref 6.5–8.1)

## 2023-01-22 LAB — I-STAT CHEM 8, ED
BUN: 5 mg/dL — ABNORMAL LOW (ref 6–20)
Calcium, Ion: 1.07 mmol/L — ABNORMAL LOW (ref 1.15–1.40)
Chloride: 109 mmol/L (ref 98–111)
Creatinine, Ser: 1.1 mg/dL — ABNORMAL HIGH (ref 0.44–1.00)
Glucose, Bld: 78 mg/dL (ref 70–99)
HCT: 39 % (ref 36.0–46.0)
Hemoglobin: 13.3 g/dL (ref 12.0–15.0)
Potassium: 3.3 mmol/L — ABNORMAL LOW (ref 3.5–5.1)
Sodium: 142 mmol/L (ref 135–145)
TCO2: 17 mmol/L — ABNORMAL LOW (ref 22–32)

## 2023-01-22 LAB — BASIC METABOLIC PANEL
Anion gap: 16 — ABNORMAL HIGH (ref 5–15)
BUN: 6 mg/dL (ref 6–20)
CO2: 19 mmol/L — ABNORMAL LOW (ref 22–32)
Calcium: 8.3 mg/dL — ABNORMAL LOW (ref 8.9–10.3)
Chloride: 104 mmol/L (ref 98–111)
Creatinine, Ser: 0.71 mg/dL (ref 0.44–1.00)
GFR, Estimated: >60 mL/min (ref >60)
Glucose, Bld: 71 mg/dL (ref 70–99)
Potassium: 4.5 mmol/L (ref 3.5–5.1)
Sodium: 139 mmol/L (ref 135–145)

## 2023-01-22 LAB — PROTIME-INR
INR: 1 (ref 0.8–1.2)
Prothrombin Time: 13.4 seconds (ref 11.4–15.2)

## 2023-01-22 LAB — SAMPLE TO BLOOD BANK

## 2023-01-22 LAB — LACTIC ACID, PLASMA
Lactic Acid, Venous: 7.4 mmol/L (ref 0.5–1.9)
Lactic Acid, Venous: 3.8 mmol/L (ref 0.5–1.9)

## 2023-01-22 LAB — ETHANOL: Alcohol, Ethyl (B): 213 mg/dL — ABNORMAL HIGH (ref <10)

## 2023-01-22 SURGERY — CLOSED REDUCTION, HIP
Anesthesia: General | Site: Hip | Laterality: Right

## 2023-01-22 MED ORDER — ACETAMINOPHEN 500 MG PO TABS
1000.0000 mg | ORAL_TABLET | Freq: Four times a day (QID) | ORAL | Status: DC
  Administered 2023-01-22 – 2023-01-28 (×15): 1000 mg via ORAL
  Filled 2023-01-22 (×19): qty 2

## 2023-01-22 MED ORDER — THIAMINE MONONITRATE 100 MG PO TABS
100.0000 mg | ORAL_TABLET | Freq: Every day | ORAL | Status: DC
  Administered 2023-01-22 – 2023-01-26 (×4): 100 mg via ORAL
  Filled 2023-01-22 (×4): qty 1

## 2023-01-22 MED ORDER — DEXMEDETOMIDINE HCL IN NACL 80 MCG/20ML IV SOLN
INTRAVENOUS | Status: DC | PRN
  Administered 2023-01-22: 12 ug via INTRAVENOUS
  Administered 2023-01-22 (×2): 8 ug via INTRAVENOUS

## 2023-01-22 MED ORDER — LORAZEPAM 1 MG PO TABS
1.0000 mg | ORAL_TABLET | ORAL | Status: AC | PRN
  Administered 2023-01-23 – 2023-01-25 (×2): 1 mg via ORAL
  Filled 2023-01-22 (×2): qty 1

## 2023-01-22 MED ORDER — FENTANYL CITRATE (PF) 100 MCG/2ML IJ SOLN
INTRAMUSCULAR | Status: DC | PRN
  Administered 2023-01-22 (×3): 50 ug via INTRAVENOUS
  Administered 2023-01-22: 25 ug via INTRAVENOUS

## 2023-01-22 MED ORDER — ONDANSETRON HCL 4 MG/2ML IJ SOLN
INTRAMUSCULAR | Status: AC
  Filled 2023-01-22: qty 2

## 2023-01-22 MED ORDER — HALOPERIDOL LACTATE 5 MG/ML IJ SOLN
INTRAMUSCULAR | Status: AC
  Filled 2023-01-22: qty 1

## 2023-01-22 MED ORDER — PROPOFOL 10 MG/ML IV BOLUS
INTRAVENOUS | Status: DC | PRN
  Administered 2023-01-22: 100 mg via INTRAVENOUS

## 2023-01-22 MED ORDER — IBUPROFEN 200 MG PO TABS
600.0000 mg | ORAL_TABLET | Freq: Four times a day (QID) | ORAL | Status: AC
  Administered 2023-01-22 – 2023-01-27 (×12): 600 mg via ORAL
  Filled 2023-01-22 (×16): qty 3

## 2023-01-22 MED ORDER — ENOXAPARIN SODIUM 30 MG/0.3ML IJ SOSY
30.0000 mg | PREFILLED_SYRINGE | Freq: Two times a day (BID) | INTRAMUSCULAR | Status: DC
  Administered 2023-01-23 – 2023-01-24 (×3): 30 mg via SUBCUTANEOUS
  Filled 2023-01-22 (×4): qty 0.3

## 2023-01-22 MED ORDER — CHLORHEXIDINE GLUCONATE CLOTH 2 % EX PADS
6.0000 | MEDICATED_PAD | Freq: Every day | CUTANEOUS | Status: DC
  Administered 2023-01-23 – 2023-01-28 (×5): 6 via TOPICAL

## 2023-01-22 MED ORDER — HALOPERIDOL LACTATE 5 MG/ML IJ SOLN
5.0000 mg | Freq: Once | INTRAMUSCULAR | Status: AC
  Administered 2023-01-22: 5 mg via INTRAVENOUS
  Filled 2023-01-22: qty 1

## 2023-01-22 MED ORDER — PROPOFOL 10 MG/ML IV BOLUS
INTRAVENOUS | Status: AC
  Filled 2023-01-22: qty 20

## 2023-01-22 MED ORDER — OXYCODONE HCL 5 MG PO TABS
5.0000 mg | ORAL_TABLET | ORAL | Status: DC | PRN
  Administered 2023-01-22 – 2023-01-23 (×4): 10 mg via ORAL
  Administered 2023-01-24: 5 mg via ORAL
  Administered 2023-01-25 (×2): 10 mg via ORAL
  Administered 2023-01-25: 5 mg via ORAL
  Administered 2023-01-25 – 2023-01-28 (×5): 10 mg via ORAL
  Filled 2023-01-22 (×3): qty 2
  Filled 2023-01-22 (×2): qty 1
  Filled 2023-01-22 (×2): qty 2
  Filled 2023-01-22: qty 1
  Filled 2023-01-22 (×7): qty 2
  Filled 2023-01-22: qty 1

## 2023-01-22 MED ORDER — DOCUSATE SODIUM 100 MG PO CAPS
100.0000 mg | ORAL_CAPSULE | Freq: Two times a day (BID) | ORAL | Status: DC
  Administered 2023-01-22 – 2023-01-27 (×11): 100 mg via ORAL
  Filled 2023-01-22 (×11): qty 1

## 2023-01-22 MED ORDER — FOLIC ACID 1 MG PO TABS
1.0000 mg | ORAL_TABLET | Freq: Every day | ORAL | Status: DC
  Administered 2023-01-22 – 2023-01-26 (×4): 1 mg via ORAL
  Filled 2023-01-22 (×4): qty 1

## 2023-01-22 MED ORDER — METHOCARBAMOL 500 MG PO TABS
500.0000 mg | ORAL_TABLET | Freq: Three times a day (TID) | ORAL | Status: AC
  Administered 2023-01-22 – 2023-01-24 (×7): 500 mg via ORAL
  Filled 2023-01-22 (×7): qty 1

## 2023-01-22 MED ORDER — HALOPERIDOL LACTATE 5 MG/ML IJ SOLN: 2.5000 mg | Freq: Once | INTRAMUSCULAR | Status: DC

## 2023-01-22 MED ORDER — FENTANYL CITRATE PF 50 MCG/ML IJ SOSY
PREFILLED_SYRINGE | INTRAMUSCULAR | Status: AC
  Administered 2023-01-22: 50 ug via INTRAVENOUS
  Filled 2023-01-22: qty 1

## 2023-01-22 MED ORDER — HYDRALAZINE HCL 20 MG/ML IJ SOLN: 10.0000 mg | INTRAMUSCULAR | Status: DC | PRN

## 2023-01-22 MED ORDER — OXYCODONE HCL 5 MG/5ML PO SOLN: 5.0000 mg | Freq: Once | ORAL | Status: DC | PRN

## 2023-01-22 MED ORDER — MIDAZOLAM HCL 2 MG/2ML IJ SOLN
INTRAMUSCULAR | Status: AC
  Filled 2023-01-22: qty 2

## 2023-01-22 MED ORDER — IOHEXOL 350 MG/ML SOLN
75.0000 mL | Freq: Once | INTRAVENOUS | Status: AC | PRN
  Administered 2023-01-22: 75 mL via INTRAVENOUS

## 2023-01-22 MED ORDER — ONDANSETRON 4 MG PO TBDP: 4.0000 mg | ORAL_TABLET | Freq: Four times a day (QID) | ORAL | Status: DC | PRN

## 2023-01-22 MED ORDER — 0.9 % SODIUM CHLORIDE (POUR BTL) OPTIME
TOPICAL | Status: DC | PRN
  Administered 2023-01-22: 1000 mL

## 2023-01-22 MED ORDER — DEXAMETHASONE SODIUM PHOSPHATE 10 MG/ML IJ SOLN
INTRAMUSCULAR | Status: DC | PRN
  Administered 2023-01-22: 10 mg via INTRAVENOUS

## 2023-01-22 MED ORDER — LACTATED RINGERS IV SOLN: INTRAVENOUS | Status: DC | PRN

## 2023-01-22 MED ORDER — FENTANYL CITRATE (PF) 100 MCG/2ML IJ SOLN: 25.0000 ug | INTRAMUSCULAR | Status: DC | PRN

## 2023-01-22 MED ORDER — ONDANSETRON HCL 4 MG/2ML IJ SOLN: 4.0000 mg | Freq: Four times a day (QID) | INTRAMUSCULAR | Status: DC | PRN

## 2023-01-22 MED ORDER — CEFAZOLIN SODIUM-DEXTROSE 2-4 GM/100ML-% IV SOLN
2.0000 g | Freq: Once | INTRAVENOUS | Status: AC
  Administered 2023-01-22: 2 g via INTRAVENOUS
  Filled 2023-01-22: qty 100

## 2023-01-22 MED ORDER — ADULT MULTIVITAMIN W/MINERALS CH
1.0000 | ORAL_TABLET | Freq: Every day | ORAL | Status: DC
  Administered 2023-01-23 – 2023-01-26 (×3): 1 via ORAL
  Filled 2023-01-22 (×4): qty 1

## 2023-01-22 MED ORDER — FENTANYL CITRATE PF 50 MCG/ML IJ SOSY
50.0000 ug | PREFILLED_SYRINGE | INTRAMUSCULAR | Status: DC | PRN
  Administered 2023-01-22: 50 ug via INTRAVENOUS
  Filled 2023-01-22 (×2): qty 1

## 2023-01-22 MED ORDER — FENTANYL CITRATE (PF) 250 MCG/5ML IJ SOLN
INTRAMUSCULAR | Status: AC
  Filled 2023-01-22: qty 5

## 2023-01-22 MED ORDER — SUCCINYLCHOLINE CHLORIDE 200 MG/10ML IV SOSY
PREFILLED_SYRINGE | INTRAVENOUS | Status: DC | PRN
  Administered 2023-01-22: 100 mg via INTRAVENOUS

## 2023-01-22 MED ORDER — METOPROLOL TARTRATE 5 MG/5ML IV SOLN: 5.0000 mg | Freq: Four times a day (QID) | INTRAVENOUS | Status: DC | PRN

## 2023-01-22 MED ORDER — SUGAMMADEX SODIUM 200 MG/2ML IV SOLN
INTRAVENOUS | Status: DC | PRN
  Administered 2023-01-22: 300 mg via INTRAVENOUS

## 2023-01-22 MED ORDER — OXYCODONE HCL 5 MG PO TABS: 5.0000 mg | ORAL_TABLET | Freq: Once | ORAL | Status: DC | PRN

## 2023-01-22 MED ORDER — CEFAZOLIN SODIUM-DEXTROSE 2-4 GM/100ML-% IV SOLN
2.0000 g | INTRAVENOUS | Status: AC
  Administered 2023-01-22: 2 g via INTRAVENOUS

## 2023-01-22 MED ORDER — LACTATED RINGERS IV BOLUS
1000.0000 mL | Freq: Once | INTRAVENOUS | Status: AC
  Administered 2023-01-22: 1000 mL via INTRAVENOUS

## 2023-01-22 MED ORDER — PHENYLEPHRINE HCL (PRESSORS) 10 MG/ML IV SOLN
INTRAVENOUS | Status: DC | PRN
  Administered 2023-01-22 (×5): 160 ug via INTRAVENOUS

## 2023-01-22 MED ORDER — ONDANSETRON HCL 4 MG/2ML IJ SOLN
4.0000 mg | Freq: Four times a day (QID) | INTRAMUSCULAR | Status: DC | PRN
  Administered 2023-01-23 – 2023-01-25 (×2): 4 mg via INTRAVENOUS
  Filled 2023-01-22 (×2): qty 2

## 2023-01-22 MED ORDER — MIDAZOLAM HCL 5 MG/5ML IJ SOLN
INTRAMUSCULAR | Status: DC | PRN
  Administered 2023-01-22: 2 mg via INTRAVENOUS

## 2023-01-22 MED ORDER — FENTANYL CITRATE PF 50 MCG/ML IJ SOSY: 50.0000 ug | PREFILLED_SYRINGE | Freq: Once | INTRAMUSCULAR | Status: AC

## 2023-01-22 MED ORDER — BACITRACIN ZINC 500 UNIT/GM EX OINT
TOPICAL_OINTMENT | Freq: Two times a day (BID) | CUTANEOUS | Status: DC
  Administered 2023-01-25 – 2023-01-28 (×5): 31.5 via TOPICAL
  Filled 2023-01-22 (×2): qty 28.4

## 2023-01-22 MED ORDER — FENTANYL CITRATE (PF) 100 MCG/2ML IJ SOLN
INTRAMUSCULAR | Status: AC
  Filled 2023-01-22: qty 2

## 2023-01-22 MED ORDER — ONDANSETRON HCL 4 MG/2ML IJ SOLN
INTRAMUSCULAR | Status: DC | PRN
  Administered 2023-01-22: 4 mg via INTRAVENOUS

## 2023-01-22 MED ORDER — ROCURONIUM BROMIDE 100 MG/10ML IV SOLN
INTRAVENOUS | Status: DC | PRN
  Administered 2023-01-22: 50 mg via INTRAVENOUS

## 2023-01-22 MED ORDER — THIAMINE HCL 100 MG/ML IJ SOLN
100.0000 mg | Freq: Every day | INTRAMUSCULAR | Status: DC
  Filled 2023-01-22: qty 2

## 2023-01-22 MED ORDER — LACTATED RINGERS IV SOLN: INTRAVENOUS | Status: DC

## 2023-01-22 MED ORDER — LIDOCAINE HCL (CARDIAC) PF 100 MG/5ML IV SOSY
PREFILLED_SYRINGE | INTRAVENOUS | Status: DC | PRN
  Administered 2023-01-22: 50 mg via INTRAVENOUS

## 2023-01-22 MED ORDER — LIDOCAINE 2% (20 MG/ML) 5 ML SYRINGE
INTRAMUSCULAR | Status: AC
  Filled 2023-01-22: qty 5

## 2023-01-22 MED ORDER — METHOCARBAMOL 1000 MG/10ML IJ SOLN
500.0000 mg | Freq: Three times a day (TID) | INTRAVENOUS | Status: AC
  Filled 2023-01-22: qty 5

## 2023-01-22 MED ORDER — SUCCINYLCHOLINE CHLORIDE 200 MG/10ML IV SOSY
PREFILLED_SYRINGE | INTRAVENOUS | Status: AC
  Filled 2023-01-22: qty 10

## 2023-01-22 MED ORDER — HYDROMORPHONE HCL 1 MG/ML IJ SOLN
0.5000 mg | INTRAMUSCULAR | Status: DC | PRN
  Administered 2023-01-22 – 2023-01-23 (×2): 0.5 mg via INTRAVENOUS
  Filled 2023-01-22 (×2): qty 0.5

## 2023-01-22 MED ORDER — POLYETHYLENE GLYCOL 3350 17 G PO PACK: 17.0000 g | PACK | Freq: Every day | ORAL | Status: DC | PRN

## 2023-01-22 SURGICAL SUPPLY — 44 items
ADH SKN CLS APL DERMABOND .7 (GAUZE/BANDAGES/DRESSINGS)
APL PRP STRL LF DISP 70% ISPRP (MISCELLANEOUS)
BAG COUNTER SPONGE SURGICOUNT (BAG) ×3 IMPLANT
BAG SPNG CNTER NS LX DISP (BAG) ×3
BNDG GAUZE DERMACEA FLUFF 4 (GAUZE/BANDAGES/DRESSINGS) IMPLANT
BNDG GZE DERMACEA 4 6PLY (GAUZE/BANDAGES/DRESSINGS) ×6
BRUSH SCRUB EZ PLAIN DRY (MISCELLANEOUS) ×3 IMPLANT
CATH FOLEY LATEX FREE 16FR (CATHETERS) ×3
CATH FOLEY LF 16FR (CATHETERS) IMPLANT
CHLORAPREP W/TINT 26 (MISCELLANEOUS) ×3 IMPLANT
COVER BACK TABLE 60X90IN (DRAPES) IMPLANT
COVER SURGICAL LIGHT HANDLE (MISCELLANEOUS) ×3 IMPLANT
DERMABOND ADVANCED .7 DNX12 (GAUZE/BANDAGES/DRESSINGS) IMPLANT
DRAPE C-ARM 42X72 X-RAY (DRAPES) ×3 IMPLANT
DRAPE C-ARMOR (DRAPES) ×3 IMPLANT
DRAPE STERI IOBAN 125X83 (DRAPES) ×3 IMPLANT
DRAPE U-SHAPE 47X51 STRL (DRAPES) ×6 IMPLANT
DRSG TEGADERM 4X4.75 (GAUZE/BANDAGES/DRESSINGS) ×3 IMPLANT
DURAPREP 26ML APPLICATOR (WOUND CARE) IMPLANT
ELECT REM PT RETURN 15FT ADLT (MISCELLANEOUS) ×3 IMPLANT
GAUZE SPONGE 4X4 12PLY STRL (GAUZE/BANDAGES/DRESSINGS) ×3 IMPLANT
GAUZE XEROFORM 5X9 LF (GAUZE/BANDAGES/DRESSINGS) IMPLANT
GLOVE BIO SURGEON STRL SZ 6.5 (GLOVE) ×9 IMPLANT
GLOVE BIO SURGEON STRL SZ8 (GLOVE) IMPLANT
GLOVE BIOGEL PI IND STRL 6.5 (GLOVE) ×3 IMPLANT
GLOVE BIOGEL PI IND STRL 8 (GLOVE) ×3 IMPLANT
GLOVE SURG ORTHO LTX SZ8 (GLOVE) ×6 IMPLANT
GOWN STRL REUS W/ TWL LRG LVL3 (GOWN DISPOSABLE) ×3 IMPLANT
GOWN STRL REUS W/ TWL XL LVL3 (GOWN DISPOSABLE) ×6 IMPLANT
GOWN STRL REUS W/TWL LRG LVL3 (GOWN DISPOSABLE) ×3
GOWN STRL REUS W/TWL XL LVL3 (GOWN DISPOSABLE) ×6
KIT BASIN OR (CUSTOM PROCEDURE TRAY) ×3 IMPLANT
KIT TURNOVER KIT A (KITS) ×3 IMPLANT
MANIFOLD NEPTUNE II (INSTRUMENTS) ×3 IMPLANT
NS IRRIG 1000ML POUR BTL (IV SOLUTION) ×3 IMPLANT
PACK GENERAL/GYN (CUSTOM PROCEDURE TRAY) ×3 IMPLANT
PAD ARMBOARD 7.5X6 YLW CONV (MISCELLANEOUS) ×6 IMPLANT
SPONGE LAP 18X18 X RAY DECT (DISPOSABLE) IMPLANT
SUT MNCRL AB 3-0 PS2 18 (SUTURE) ×3 IMPLANT
SUT VIC AB 0 CT1 27 (SUTURE)
SUT VIC AB 0 CT1 27XBRD ANBCTR (SUTURE) ×3 IMPLANT
SUT VIC AB 2-0 CT2 27 (SUTURE) ×3 IMPLANT
TOWEL GREEN STERILE FF (TOWEL DISPOSABLE) ×9 IMPLANT
WATER STERILE IRR 1000ML POUR (IV SOLUTION) ×3 IMPLANT
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
## 2023-01-22 NOTE — Transfer of Care (Signed)
Immediate Anesthesia Transfer of Care Note  Patient: Legacie Litt  Procedure(s) Performed: CLOSED REDUCTION HIP (Right: Hip) SKELETAL TRACTION APPLICATION (Right) CLOSED REDUCTION FINGER WITH PERCUTANEOUS PINNING (Finger)  Patient Location: PACU  Anesthesia Type:General  Level of Consciousness: drowsy  Airway & Oxygen Therapy: Patient Spontanous Breathing and Patient connected to nasal cannula oxygen  Post-op Assessment: Report given to RN, Post -op Vital signs reviewed and stable, and Patient moving all extremities  Post vital signs: Reviewed and stable  Last Vitals:  Vitals Value Taken Time  BP 96/49 01/22/23 0502  Temp 36.8 C 01/22/23 0502  Pulse 99 01/22/23 0504  Resp 15 01/22/23 0509  SpO2 100 % 01/22/23 0504  Vitals shown include unvalidated device data.  Last Pain:  Vitals:   01/22/23 0158  TempSrc:   PainSc: 10-Worst pain ever         Complications: No notable events documented.
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
## 2023-01-22 NOTE — TOC Progression Note (Signed)
Transition of Care [facility]) - Initial/Assessment Note    Patient Details  Name: Patricia [name] MRN: [id number] Date of Birth: [birth year]  [facility]) CM/SW Contact:    Ralene Bathe, LCSW Phone Number: 01/22/2023, 2:45 PM  Clinical Narrative:                 LCSW submitted IVC paperwork.  IVC expires on 01/29/2023.  Case number- [id number]   Patient Goals and CMS Choice            Expected Discharge Plan and Services                                              Prior Living Arrangements/Services                       Activities of Daily Living      Permission Sought/Granted                  Emotional Assessment              Admission diagnosis:  Multiple pelvic fractures (HCC) [S32.82XA] Multiple closed fractures of pelvis without disruption of pelvic ring, initial encounter (HCC) [S32.82XA] Closed displaced fracture of right acetabulum, unspecified portion of acetabulum, initial encounter (HCC) [S32.401A] Patient Active Problem List   Diagnosis Date Noted   Multiple pelvic fractures (HCC) 01/22/2023   Adjustment disorder with mixed anxiety and depressed mood 01/21/2023   Vaginal discharge 01/29/2022   Birth control counseling 01/29/2022   Menorrhagia 02/02/2020   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   Right upper lobe pulmonary infiltrate 01/28/2020   Supraclavicular adenopathy    Positive QuantiFERON-TB Gold test    Weight loss 01/24/2020   Anemia 01/23/2020   Suicide attempt (HCC)    Depression 05/24/2015   Overdose 05/23/2015   Suicide attempt by acetaminophen overdose (HCC) 05/23/2015   PCP:  Celine Mans, MD Pharmacy:   [facility] [phone] - [place], [place] - [address] AT [facility] [address] [place] [address] Phone: [phone] Fax: [phone]     Social Determinants of Health (SDOH) Social History: SDOH Screenings   Depression (PHQ2-9): Medium Risk (01/29/2022)   Tobacco Use: Low Risk  (01/22/2023)   SDOH Interventions:     Readmission Risk Interventions     No data to display
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
## 2023-01-22 NOTE — Progress Notes (Signed)
Orthopedic Tech Progress Note Patient Details:  Patricia [name] May 13, 2001 [id number]  Patient ID: Roderic Palau, female   DOB: 2000/10/31, 21 y.o.   MRN: [id number] Level 2 trauma not needed.  Gail Vendetti L Lucrecia Mcphearson 01/22/2023, 2:18 AM
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
## 2023-01-22 NOTE — Anesthesia Preprocedure Evaluation (Signed)
Anesthesia Evaluation  Patient identified by MRN, date of birth, ID band Patient awake    Reviewed: Allergy & Precautions, H&P , NPO status , Patient's Chart, lab work & pertinent test results  Airway Mallampati: II   Neck ROM: full    Dental   Pulmonary neg pulmonary ROS   breath sounds clear to auscultation       Cardiovascular negative cardio ROS  Rhythm:regular Rate:Normal     Neuro/Psych  PSYCHIATRIC DISORDERS  Depression       GI/Hepatic   Endo/Other    Renal/GU      Musculoskeletal   Abdominal   Peds  Hematology   Anesthesia Other Findings   Reproductive/Obstetrics                             Anesthesia Physical Anesthesia Plan  ASA: 2 and emergent  Anesthesia Plan: General   Post-op Pain Management:    Induction: Intravenous  PONV Risk Score and Plan: 3 and Ondansetron, Dexamethasone, Midazolam and Treatment may vary due to age or medical condition  Airway Management Planned: Oral ETT  Additional Equipment:   Intra-op Plan:   Post-operative Plan: Extubation in OR  Informed Consent: I have reviewed the patients History and Physical, chart, labs and discussed the procedure including the risks, benefits and alternatives for the proposed anesthesia with the patient or authorized representative who has indicated his/her understanding and acceptance.     Dental advisory given  Plan Discussed with: CRNA, Anesthesiologist and Surgeon  Anesthesia Plan Comments:        Anesthesia Quick Evaluation
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
## 2023-01-22 NOTE — Anesthesia Procedure Notes (Signed)
Procedure Name: Intubation Date/Time: 01/22/2023 4:08 AM  Performed by: Ahri Olson T, CRNAPre-anesthesia Checklist: Patient identified, Emergency Drugs available, Suction available and Patient being monitored Patient Re-evaluated:Patient Re-evaluated prior to induction Oxygen Delivery Method: Circle system utilized Preoxygenation: Pre-oxygenation with 100% oxygen Induction Type: IV induction Ventilation: Mask ventilation without difficulty Laryngoscope Size: Glidescope and 3 Grade View: Grade I Tube type: Oral Tube size: 7.0 mm Number of attempts: 1 Airway Equipment and Method: Stylet and Oral airway Placement Confirmation: ETT inserted through vocal cords under direct vision, positive ETCO2 and breath sounds checked- equal and bilateral Secured at: 22 cm Tube secured with: Tape Dental Injury: Teeth and Oropharynx as per pre-operative assessment

## 2023-01-22 NOTE — Progress Notes (Signed)
Patient states she had tampon in her few weeks ago, that got stuck and couldn't remove.

## 2023-01-22 NOTE — Progress Notes (Signed)
Discussed case with Dr. Nicanor Alcon. Severely, displaced both column acetabulum fracture with posterior hip dislocation. Hip likely to be unstable with closed reduction alone. Also patient intoxicated and no family presently available for consent. Patient's acetabulum fracture will need fixation by an orthopaedic trauma specialist. Dr. Nicanor Alcon to reach out ortho at [facility] to see if they have trauma subspecialist available this weekend.

## 2023-01-22 NOTE — Progress Notes (Signed)
     Subjective: Patient reports pain as well-controlled.  Lying comfortably in bed.  Localizes pain to the pelvis and right hip area.  Denies pain in other joints or extremities.  Denies distal numbness and tingling.  Objective:   VITALS:   Vitals:   01/22/23 0515 01/22/23 0530 01/22/23 0545 01/22/23 0608  BP: 102/65 111/82 103/71 104/63  Pulse: 88 96 80 72  Resp: 15 17 14 14   Temp:   98 F (36.7 C) 97.9 F (36.6 C)  TempSrc:    Oral  SpO2: 100% 99% 99% 97%  Weight:       Bilateral lower extremities: Sensation intact distally Intact pulses distally Dorsiflexion/Plantar flexion intact Compartment soft   Skeletal traction in place 15 pounds off the right distal femur.  Lab Results  Component Value Date   WBC 12.1 (H) 01/22/2023   HGB 10.2 (L) 01/22/2023   HCT 33.8 (L) 01/22/2023   MCV 88.7 01/22/2023   PLT 295 01/22/2023   BMET    Component Value Date/Time   NA 139 01/22/2023 0631   K 4.5 01/22/2023 0631   CL 104 01/22/2023 0631   CO2 19 (L) 01/22/2023 0631   GLUCOSE 71 01/22/2023 0631   BUN 6 01/22/2023 0631   CREATININE 0.71 01/22/2023 0631   CALCIUM 8.3 (L) 01/22/2023 0631   GFRNONAA >60 01/22/2023 0631   Xray: Postreduction x-ray demonstrates reduced femoral acetabular joint improved alignment of the acetabulum fracture mild widening of the right SI joint  Assessment/Plan: Day of Surgery   Principal Problem:   Multiple pelvic fractures (HCC)  S/p closed reduction application of distal femoral skeletal traction on 01/22/2023  Post op recs: WB: NWB RLE in 15# skeletal traction Imaging: PACU xrays, will obtain a CT scan for preoperative planning for her acetabular and pelvic ring injury, plan to maintain the skeletal traction during the CT scan, Ortho tech aware and will help with transferring the patient with the traction before and after the scan. Dressing: keep intact until return to the OR DVT prophylaxis: lovenox postop Follow up: Discussed with  Dr. Carola Frost possible return to the OR on Monday for open reduction for fixation of right acetabulum and pelvic ring injury   Emmani Lesueur A Kyaire Gruenewald 01/22/2023, 10:17 AM   Weber Cooks, MD  Contact information:   [email or web address] 7am-5pm epic message Dr. Blanchie Dessert, or call office for patient follow up: [phone] After hours and holidays please check [email or web address] for group call information for [facility]

## 2023-01-23 ENCOUNTER — Inpatient Hospital Stay (HOSPITAL_COMMUNITY): Payer: Medicaid Other

## 2023-01-23 ENCOUNTER — Encounter (HOSPITAL_COMMUNITY): Payer: Self-pay | Admitting: Orthopedic Surgery

## 2023-01-23 DIAGNOSIS — F1010 Alcohol abuse, uncomplicated: Secondary | ICD-10-CM | POA: Diagnosis not present

## 2023-01-23 DIAGNOSIS — R45851 Suicidal ideations: Secondary | ICD-10-CM | POA: Diagnosis not present

## 2023-01-23 DIAGNOSIS — D62 Acute posthemorrhagic anemia: Secondary | ICD-10-CM | POA: Diagnosis not present

## 2023-01-23 DIAGNOSIS — S3289XA Fracture of other parts of pelvis, initial encounter for closed fracture: Secondary | ICD-10-CM | POA: Diagnosis not present

## 2023-01-23 DIAGNOSIS — S3282XA Multiple fractures of pelvis without disruption of pelvic ring, initial encounter for closed fracture: Secondary | ICD-10-CM | POA: Diagnosis not present

## 2023-01-23 DIAGNOSIS — S80811A Abrasion, right lower leg, initial encounter: Secondary | ICD-10-CM | POA: Diagnosis not present

## 2023-01-23 LAB — CBC
HCT: 29.6 % — ABNORMAL LOW (ref 36.0–46.0)
Hemoglobin: 9.2 g/dL — ABNORMAL LOW (ref 12.0–15.0)
MCH: 27.7 pg (ref 26.0–34.0)
MCHC: 31.1 g/dL (ref 30.0–36.0)
MCV: 89.2 fL (ref 80.0–100.0)
Platelets: 266 10*3/uL (ref 150–400)
RBC: 3.32 MIL/uL — ABNORMAL LOW (ref 3.87–5.11)
RDW: 15.9 % — ABNORMAL HIGH (ref 11.5–15.5)
WBC: 10.5 10*3/uL (ref 4.0–10.5)
nRBC: 0 % (ref 0.0–0.2)

## 2023-01-23 LAB — BASIC METABOLIC PANEL
Anion gap: 11 (ref 5–15)
BUN: 7 mg/dL (ref 6–20)
CO2: 21 mmol/L — ABNORMAL LOW (ref 22–32)
Calcium: 8.1 mg/dL — ABNORMAL LOW (ref 8.9–10.3)
Chloride: 101 mmol/L (ref 98–111)
Creatinine, Ser: 0.8 mg/dL (ref 0.44–1.00)
GFR, Estimated: >60 mL/min (ref >60)
Glucose, Bld: 83 mg/dL (ref 70–99)
Potassium: 4 mmol/L (ref 3.5–5.1)
Sodium: 133 mmol/L — ABNORMAL LOW (ref 135–145)

## 2023-01-23 MED ORDER — HYDROMORPHONE HCL 1 MG/ML IJ SOLN
0.5000 mg | INTRAMUSCULAR | Status: DC | PRN
  Administered 2023-01-24 – 2023-01-26 (×4): 1 mg via INTRAVENOUS
  Filled 2023-01-23 (×4): qty 1

## 2023-01-23 NOTE — Progress Notes (Addendum)
     Subjective: Patient reports pain as well-controlled.  Lying comfortably in bed.  Localizes pain to the pelvis and right hip area.  Denies pain in other joints or extremities.  Denies distal numbness and tingling. Anxious about CT scan later today and being moved.   Objective:   VITALS:   Vitals:   01/22/23 1107 01/22/23 1527 01/22/23 1931 01/23/23 0355  BP: 109/66 (!) 113/57 112/60 110/65  Pulse: 87 75 74 68  Resp: (!) 21 20 17 18   Temp: 98.1 F (36.7 C) 97.9 F (36.6 C) 98.4 F (36.9 C)   TempSrc: Oral Oral Oral   SpO2: 100% 99% 100% 100%  Weight:       Bilateral lower extremities: Sensation intact distally Intact pulses distally Dorsiflexion/Plantar flexion intact Compartment soft   Skeletal traction in place 15 pounds off the right distal femur.  Lab Results  Component Value Date   WBC 10.5 01/23/2023   HGB 9.2 (L) 01/23/2023   HCT 29.6 (L) 01/23/2023   MCV 89.2 01/23/2023   PLT 266 01/23/2023   BMET    Component Value Date/Time   NA 133 (L) 01/23/2023 0132   K 4.0 01/23/2023 0132   CL 101 01/23/2023 0132   CO2 21 (L) 01/23/2023 0132   GLUCOSE 83 01/23/2023 0132   BUN 7 01/23/2023 0132   CREATININE 0.80 01/23/2023 0132   CALCIUM 8.1 (L) 01/23/2023 0132   GFRNONAA >60 01/23/2023 0132   Xray: Postreduction x-ray demonstrates reduced femoral acetabular joint improved alignment of the acetabulum fracture mild widening of the right SI joint  Assessment/Plan: 1 Day Post-Op   Principal Problem:   Multiple pelvic fractures (HCC)  S/p closed reduction application of distal femoral skeletal traction on 01/22/2023  Post op recs: WB: NWB RLE in 15# skeletal traction Imaging: PACU xrays, will obtain a CT scan for preoperative planning for her acetabular and pelvic ring injury, plan to maintain the skeletal traction during the CT scan, Ortho tech aware and will help with transferring the patient with the traction before and after the scan. - patient will need  to be premedicated - called CT as CT scan was not done yesterday, but have not heard back from them yet as they were dealing with a code stroke. Spoke to CT at 9:52 AM, relayed plan and need to call ortho tech, expressed understanding. - spoke with orthotech, they are aware of patient and will be available for transfer. On and off bed with skeletal traction, discussed not rotating hip during transfer and ok to hang weight off of gantry during the CT. - please call if patient hypotensive after CT transfers or when returning to floor Dressing: keep intact until return to the OR DVT prophylaxis: lovenox postop Follow up: Dr. Blanchie Dessert discussed with Dr. Carola Frost plan for return to the OR on Monday for open reduction for fixation of right acetabulum and pelvic ring injury. NPO after midnight.   Patricia [name] 01/23/2023, 8:25 AM

## 2023-01-23 NOTE — Anesthesia Postprocedure Evaluation (Signed)
Anesthesia Post Note  Patient: Transport planner  Procedure(s) Performed: CLOSED REDUCTION HIP (Right: Hip) SKELETAL TRACTION APPLICATION (Right) CLOSED REDUCTION FINGER WITH PERCUTANEOUS PINNING (Finger)     Anesthesia Type: General Anesthetic complications: no   No notable events documented.  Last Vitals:  Vitals:   01/22/23 1931 01/23/23 0355  BP: 112/60 110/65  Pulse: 74 68  Resp: 17 18  Temp: 36.9 C   SpO2: 100% 100%    Last Pain:  Vitals:   01/22/23 2208  TempSrc:   PainSc: 0-No pain                 Timothy Townsel S

## 2023-01-23 NOTE — Plan of Care (Signed)
  Problem: Activity: Goal: Risk for activity intolerance will decrease Outcome: Progressing   Problem: Coping: Goal: Level of anxiety will decrease Outcome: Progressing   Problem: Pain Managment: Goal: General experience of comfort will improve Outcome: Progressing   Problem: Safety: Goal: Ability to remain free from injury will improve Outcome: Progressing   
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
## 2023-01-24 ENCOUNTER — Inpatient Hospital Stay (HOSPITAL_COMMUNITY): Payer: Medicaid Other | Admitting: Certified Registered Nurse Anesthetist

## 2023-01-24 ENCOUNTER — Encounter (HOSPITAL_COMMUNITY): Admission: EM | Disposition: A | Discharge status: Rehabilitation Facility | Payer: Self-pay | Source: Home / Self Care

## 2023-01-24 ENCOUNTER — Encounter (HOSPITAL_COMMUNITY): Payer: Self-pay | Admitting: Surgery

## 2023-01-24 ENCOUNTER — Inpatient Hospital Stay (HOSPITAL_COMMUNITY): Payer: Medicaid Other

## 2023-01-24 ENCOUNTER — Other Ambulatory Visit: Payer: Self-pay

## 2023-01-24 DIAGNOSIS — W448XXA Other foreign body entering into or through a natural orifice, initial encounter: Secondary | ICD-10-CM

## 2023-01-24 DIAGNOSIS — S32401A Unspecified fracture of right acetabulum, initial encounter for closed fracture: Secondary | ICD-10-CM

## 2023-01-24 DIAGNOSIS — N898 Other specified noninflammatory disorders of vagina: Secondary | ICD-10-CM

## 2023-01-24 DIAGNOSIS — S32461A Displaced associated transverse-posterior fracture of right acetabulum, initial encounter for closed fracture: Secondary | ICD-10-CM | POA: Diagnosis not present

## 2023-01-24 DIAGNOSIS — S3282XA Multiple fractures of pelvis without disruption of pelvic ring, initial encounter for closed fracture: Secondary | ICD-10-CM | POA: Diagnosis not present

## 2023-01-24 DIAGNOSIS — S332XXA Dislocation of sacroiliac and sacrococcygeal joint, initial encounter: Secondary | ICD-10-CM | POA: Diagnosis not present

## 2023-01-24 DIAGNOSIS — S80811A Abrasion, right lower leg, initial encounter: Secondary | ICD-10-CM | POA: Diagnosis not present

## 2023-01-24 DIAGNOSIS — S3289XA Fracture of other parts of pelvis, initial encounter for closed fracture: Secondary | ICD-10-CM | POA: Diagnosis not present

## 2023-01-24 DIAGNOSIS — S32481A Displaced dome fracture of right acetabulum, initial encounter for closed fracture: Secondary | ICD-10-CM | POA: Diagnosis not present

## 2023-01-24 DIAGNOSIS — S32811A Multiple fractures of pelvis with unstable disruption of pelvic ring, initial encounter for closed fracture: Secondary | ICD-10-CM | POA: Diagnosis not present

## 2023-01-24 DIAGNOSIS — R45851 Suicidal ideations: Secondary | ICD-10-CM | POA: Diagnosis not present

## 2023-01-24 DIAGNOSIS — T192XXA Foreign body in vulva and vagina, initial encounter: Secondary | ICD-10-CM

## 2023-01-24 DIAGNOSIS — F1010 Alcohol abuse, uncomplicated: Secondary | ICD-10-CM | POA: Diagnosis not present

## 2023-01-24 DIAGNOSIS — D62 Acute posthemorrhagic anemia: Secondary | ICD-10-CM | POA: Diagnosis not present

## 2023-01-24 DIAGNOSIS — S73004A Unspecified dislocation of right hip, initial encounter: Secondary | ICD-10-CM | POA: Diagnosis not present

## 2023-01-24 HISTORY — PX: ORIF ACETABULAR FRACTURE: SHX5029

## 2023-01-24 HISTORY — PX: FOREIGN BODY REMOVAL VAGINAL: SHX5324

## 2023-01-24 LAB — CBC
HCT: 27.4 % — ABNORMAL LOW (ref 36.0–46.0)
Hemoglobin: 8.4 g/dL — ABNORMAL LOW (ref 12.0–15.0)
MCH: 26.9 pg (ref 26.0–34.0)
MCHC: 30.7 g/dL (ref 30.0–36.0)
MCV: 87.8 fL (ref 80.0–100.0)
Platelets: 236 10*3/uL (ref 150–400)
RBC: 3.12 MIL/uL — ABNORMAL LOW (ref 3.87–5.11)
RDW: 16.1 % — ABNORMAL HIGH (ref 11.5–15.5)
WBC: 6.8 10*3/uL (ref 4.0–10.5)
nRBC: 0 % (ref 0.0–0.2)

## 2023-01-24 LAB — POCT I-STAT, CHEM 8
BUN: 5 mg/dL — ABNORMAL LOW (ref 6–20)
Calcium, Ion: 1.08 mmol/L — ABNORMAL LOW (ref 1.15–1.40)
Chloride: 101 mmol/L (ref 98–111)
Creatinine, Ser: 0.6 mg/dL (ref 0.44–1.00)
Glucose, Bld: 78 mg/dL (ref 70–99)
HCT: 28 % — ABNORMAL LOW (ref 36.0–46.0)
Hemoglobin: 9.5 g/dL — ABNORMAL LOW (ref 12.0–15.0)
Potassium: 4.7 mmol/L (ref 3.5–5.1)
Sodium: 137 mmol/L (ref 135–145)
TCO2: 28 mmol/L (ref 22–32)

## 2023-01-24 LAB — BASIC METABOLIC PANEL
Anion gap: 8 (ref 5–15)
BUN: 7 mg/dL (ref 6–20)
CO2: 23 mmol/L (ref 22–32)
Calcium: 7.8 mg/dL — ABNORMAL LOW (ref 8.9–10.3)
Chloride: 103 mmol/L (ref 98–111)
Creatinine, Ser: 0.75 mg/dL (ref 0.44–1.00)
GFR, Estimated: >60 mL/min (ref >60)
Glucose, Bld: 78 mg/dL (ref 70–99)
Potassium: 3.4 mmol/L — ABNORMAL LOW (ref 3.5–5.1)
Sodium: 134 mmol/L — ABNORMAL LOW (ref 135–145)

## 2023-01-24 LAB — TYPE AND SCREEN
ABO/RH(D): A POS
Antibody Screen: NEGATIVE

## 2023-01-24 LAB — WET PREP, GENITAL
Sperm: NONE SEEN
Trich, Wet Prep: NONE SEEN
WBC, Wet Prep HPF POC: >=10 — AB (ref <10)
Yeast Wet Prep HPF POC: NONE SEEN

## 2023-01-24 LAB — PREALBUMIN: Prealbumin: 16 mg/dL — ABNORMAL LOW (ref 18–38)

## 2023-01-24 LAB — MRSA NEXT GEN BY PCR, NASAL: MRSA by PCR Next Gen: NOT DETECTED

## 2023-01-24 SURGERY — OPEN REDUCTION INTERNAL FIXATION (ORIF) ACETABULAR FRACTURE
Anesthesia: General | Site: Vagina | Laterality: Right

## 2023-01-24 MED ORDER — CEFAZOLIN SODIUM-DEXTROSE 2-4 GM/100ML-% IV SOLN
2.0000 g | Freq: Three times a day (TID) | INTRAVENOUS | Status: AC
  Administered 2023-01-24 – 2023-01-25 (×3): 2 g via INTRAVENOUS
  Filled 2023-01-24 (×3): qty 100

## 2023-01-24 MED ORDER — OXYCODONE HCL 5 MG PO TABS: 5.0000 mg | ORAL_TABLET | Freq: Once | ORAL | Status: AC | PRN

## 2023-01-24 MED ORDER — OXYCODONE HCL 5 MG/5ML PO SOLN
ORAL | Status: AC
  Filled 2023-01-24: qty 5

## 2023-01-24 MED ORDER — TRANEXAMIC ACID-NACL 1000-0.7 MG/100ML-% IV SOLN
1000.0000 mg | INTRAVENOUS | Status: AC
  Administered 2023-01-24: 1000 mg via INTRAVENOUS

## 2023-01-24 MED ORDER — HYDROMORPHONE HCL 1 MG/ML IJ SOLN
INTRAMUSCULAR | Status: AC
  Filled 2023-01-24: qty 0.5

## 2023-01-24 MED ORDER — DEXAMETHASONE SODIUM PHOSPHATE 10 MG/ML IJ SOLN
INTRAMUSCULAR | Status: AC
  Filled 2023-01-24: qty 1

## 2023-01-24 MED ORDER — HYDROMORPHONE HCL 1 MG/ML IJ SOLN
0.2500 mg | INTRAMUSCULAR | Status: DC | PRN
  Administered 2023-01-24: 0.25 mg via INTRAVENOUS
  Administered 2023-01-24 (×2): 0.5 mg via INTRAVENOUS
  Administered 2023-01-24: 0.25 mg via INTRAVENOUS

## 2023-01-24 MED ORDER — PROPOFOL 10 MG/ML IV BOLUS
INTRAVENOUS | Status: AC
  Filled 2023-01-24: qty 20

## 2023-01-24 MED ORDER — ACETAMINOPHEN 10 MG/ML IV SOLN
INTRAVENOUS | Status: AC
  Filled 2023-01-24: qty 100

## 2023-01-24 MED ORDER — MIDAZOLAM HCL 2 MG/2ML IJ SOLN
INTRAMUSCULAR | Status: AC
  Filled 2023-01-24: qty 2

## 2023-01-24 MED ORDER — ROCURONIUM BROMIDE 10 MG/ML (PF) SYRINGE
PREFILLED_SYRINGE | INTRAVENOUS | Status: DC | PRN
  Administered 2023-01-24 (×6): 20 mg via INTRAVENOUS
  Administered 2023-01-24: 60 mg via INTRAVENOUS

## 2023-01-24 MED ORDER — LACTATED RINGERS IV SOLN: INTRAVENOUS | Status: DC | PRN

## 2023-01-24 MED ORDER — PROPOFOL 500 MG/50ML IV EMUL
INTRAVENOUS | Status: DC | PRN
  Administered 2023-01-24: 25 ug/kg/min via INTRAVENOUS

## 2023-01-24 MED ORDER — POVIDONE-IODINE 10 % EX SWAB: 2.0000 | Freq: Once | CUTANEOUS | Status: DC

## 2023-01-24 MED ORDER — PHENYLEPHRINE HCL-NACL 20-0.9 MG/250ML-% IV SOLN
INTRAVENOUS | Status: DC | PRN
  Administered 2023-01-24: 25 ug/min via INTRAVENOUS

## 2023-01-24 MED ORDER — 0.9 % SODIUM CHLORIDE (POUR BTL) OPTIME
TOPICAL | Status: DC | PRN
  Administered 2023-01-24 (×2): 1000 mL

## 2023-01-24 MED ORDER — CHLORHEXIDINE GLUCONATE 0.12 % MT SOLN
OROMUCOSAL | Status: AC
  Administered 2023-01-24: 15 mL via OROMUCOSAL
  Filled 2023-01-24: qty 15

## 2023-01-24 MED ORDER — DEXMEDETOMIDINE HCL IN NACL 80 MCG/20ML IV SOLN
INTRAVENOUS | Status: AC
  Filled 2023-01-24: qty 20

## 2023-01-24 MED ORDER — HYDROMORPHONE HCL 1 MG/ML IJ SOLN
INTRAMUSCULAR | Status: AC
  Filled 2023-01-24: qty 1

## 2023-01-24 MED ORDER — ALBUMIN HUMAN 5 % IV SOLN: INTRAVENOUS | Status: DC | PRN

## 2023-01-24 MED ORDER — CHLORHEXIDINE GLUCONATE 0.12 % MT SOLN: 15.0000 mL | Freq: Once | OROMUCOSAL | Status: AC

## 2023-01-24 MED ORDER — SODIUM CHLORIDE (PF) 0.9 % IJ SOLN
INTRAMUSCULAR | Status: AC
  Filled 2023-01-24: qty 20

## 2023-01-24 MED ORDER — ONDANSETRON HCL 4 MG/2ML IJ SOLN
INTRAMUSCULAR | Status: AC
  Filled 2023-01-24: qty 2

## 2023-01-24 MED ORDER — MIDAZOLAM HCL 2 MG/2ML IJ SOLN
INTRAMUSCULAR | Status: DC | PRN
  Administered 2023-01-24 (×2): 1 mg via INTRAVENOUS

## 2023-01-24 MED ORDER — DEXAMETHASONE SODIUM PHOSPHATE 10 MG/ML IJ SOLN
INTRAMUSCULAR | Status: DC | PRN
  Administered 2023-01-24: 10 mg via INTRAVENOUS

## 2023-01-24 MED ORDER — ACETAMINOPHEN 500 MG PO TABS
1000.0000 mg | ORAL_TABLET | Freq: Once | ORAL | Status: DC
  Filled 2023-01-24: qty 2

## 2023-01-24 MED ORDER — ROCURONIUM BROMIDE 10 MG/ML (PF) SYRINGE
PREFILLED_SYRINGE | INTRAVENOUS | Status: AC
  Filled 2023-01-24: qty 10

## 2023-01-24 MED ORDER — PHENYLEPHRINE 80 MCG/ML (10ML) SYRINGE FOR IV PUSH (FOR BLOOD PRESSURE SUPPORT)
PREFILLED_SYRINGE | INTRAVENOUS | Status: AC
  Filled 2023-01-24: qty 10

## 2023-01-24 MED ORDER — PROPOFOL 10 MG/ML IV BOLUS
INTRAVENOUS | Status: DC | PRN
  Administered 2023-01-24: 30 mg via INTRAVENOUS
  Administered 2023-01-24: 150 mg via INTRAVENOUS

## 2023-01-24 MED ORDER — FENTANYL CITRATE (PF) 250 MCG/5ML IJ SOLN
INTRAMUSCULAR | Status: DC | PRN
  Administered 2023-01-24 (×6): 50 ug via INTRAVENOUS

## 2023-01-24 MED ORDER — VANCOMYCIN HCL 1000 MG IV SOLR
INTRAVENOUS | Status: AC
  Filled 2023-01-24: qty 20

## 2023-01-24 MED ORDER — CEFAZOLIN SODIUM 1 G IJ SOLR
INTRAMUSCULAR | Status: AC
  Filled 2023-01-24: qty 20

## 2023-01-24 MED ORDER — LIDOCAINE 2% (20 MG/ML) 5 ML SYRINGE
INTRAMUSCULAR | Status: AC
  Filled 2023-01-24: qty 5

## 2023-01-24 MED ORDER — PROMETHAZINE HCL 25 MG/ML IJ SOLN: 6.2500 mg | INTRAMUSCULAR | Status: DC | PRN

## 2023-01-24 MED ORDER — LACTATED RINGERS IV SOLN: INTRAVENOUS | Status: DC

## 2023-01-24 MED ORDER — SODIUM CHLORIDE (PF) 0.9 % IJ SOLN
INTRAMUSCULAR | Status: AC
  Filled 2023-01-24: qty 10

## 2023-01-24 MED ORDER — OXYCODONE HCL 5 MG/5ML PO SOLN
5.0000 mg | Freq: Once | ORAL | Status: AC | PRN
  Administered 2023-01-24: 5 mg via ORAL

## 2023-01-24 MED ORDER — ACETAMINOPHEN 10 MG/ML IV SOLN
INTRAVENOUS | Status: DC | PRN
  Administered 2023-01-24: 1000 mg via INTRAVENOUS

## 2023-01-24 MED ORDER — AMISULPRIDE (ANTIEMETIC) 5 MG/2ML IV SOLN: 10.0000 mg | Freq: Once | INTRAVENOUS | Status: DC | PRN

## 2023-01-24 MED ORDER — CEFAZOLIN SODIUM-DEXTROSE 2-4 GM/100ML-% IV SOLN
2.0000 g | INTRAVENOUS | Status: AC
  Administered 2023-01-24 (×2): 2 g via INTRAVENOUS
  Filled 2023-01-24: qty 100

## 2023-01-24 MED ORDER — MEPERIDINE HCL 25 MG/ML IJ SOLN: 6.2500 mg | INTRAMUSCULAR | Status: DC | PRN

## 2023-01-24 MED ORDER — ONDANSETRON HCL 4 MG/2ML IJ SOLN
INTRAMUSCULAR | Status: DC | PRN
  Administered 2023-01-24: 4 mg via INTRAVENOUS

## 2023-01-24 MED ORDER — CHLORHEXIDINE GLUCONATE CLOTH 2 % EX PADS: 6.0000 | MEDICATED_PAD | Freq: Every day | CUTANEOUS | Status: DC

## 2023-01-24 MED ORDER — DEXMEDETOMIDINE HCL IN NACL 80 MCG/20ML IV SOLN
INTRAVENOUS | Status: DC | PRN
  Administered 2023-01-24: 8 ug via INTRAVENOUS
  Administered 2023-01-24: 4 ug via INTRAVENOUS
  Administered 2023-01-24: 8 ug via INTRAVENOUS

## 2023-01-24 MED ORDER — ORAL CARE MOUTH RINSE: 15.0000 mL | Freq: Once | OROMUCOSAL | Status: AC

## 2023-01-24 MED ORDER — ENSURE PRE-SURGERY PO LIQD
296.0000 mL | Freq: Once | ORAL | Status: AC
  Administered 2023-01-24: 296 mL via ORAL
  Filled 2023-01-24: qty 296

## 2023-01-24 MED ORDER — MUPIROCIN 2 % EX OINT
1.0000 | TOPICAL_OINTMENT | Freq: Two times a day (BID) | CUTANEOUS | Status: DC
  Administered 2023-01-25 – 2023-01-28 (×7): 1 via NASAL
  Filled 2023-01-24 (×4): qty 22

## 2023-01-24 MED ORDER — VANCOMYCIN HCL 1000 MG IV SOLR
INTRAVENOUS | Status: DC | PRN
  Administered 2023-01-24: 1000 mg via TOPICAL

## 2023-01-24 MED ORDER — FENTANYL CITRATE (PF) 250 MCG/5ML IJ SOLN
INTRAMUSCULAR | Status: AC
  Filled 2023-01-24: qty 5

## 2023-01-24 MED ORDER — CHLORHEXIDINE GLUCONATE 4 % EX SOLN
60.0000 mL | Freq: Once | CUTANEOUS | Status: AC
  Administered 2023-01-24: 4 via TOPICAL

## 2023-01-24 MED ORDER — TRANEXAMIC ACID-NACL 1000-0.7 MG/100ML-% IV SOLN
INTRAVENOUS | Status: AC
  Filled 2023-01-24: qty 100

## 2023-01-24 MED ORDER — LIDOCAINE 2% (20 MG/ML) 5 ML SYRINGE
INTRAMUSCULAR | Status: DC | PRN
  Administered 2023-01-24: 60 mg via INTRAVENOUS

## 2023-01-24 MED ORDER — HYDROMORPHONE HCL 1 MG/ML IJ SOLN
INTRAMUSCULAR | Status: DC | PRN
  Administered 2023-01-24 (×2): .25 mg via INTRAVENOUS

## 2023-01-24 MED ORDER — SUGAMMADEX SODIUM 200 MG/2ML IV SOLN
INTRAVENOUS | Status: DC | PRN
  Administered 2023-01-24 (×2): 100 mg via INTRAVENOUS

## 2023-01-24 MED ORDER — PHENYLEPHRINE 80 MCG/ML (10ML) SYRINGE FOR IV PUSH (FOR BLOOD PRESSURE SUPPORT)
PREFILLED_SYRINGE | INTRAVENOUS | Status: DC | PRN
  Administered 2023-01-24 (×4): 80 ug via INTRAVENOUS
  Administered 2023-01-24: 160 ug via INTRAVENOUS
  Administered 2023-01-24 (×3): 80 ug via INTRAVENOUS
  Administered 2023-01-24: 160 ug via INTRAVENOUS
  Administered 2023-01-24: 80 ug via INTRAVENOUS

## 2023-01-24 SURGICAL SUPPLY — 84 items
BAG COUNTER SPONGE SURGICOUNT (BAG) ×2 IMPLANT
BAG SPNG CNTER NS LX DISP (BAG) ×4
BIT DRILL 4.8X300 (BIT) IMPLANT
BIT DRILL AO MATTA 2.5MX230M (BIT) IMPLANT
BIT DRILL STEP 3.5 (DRILL) IMPLANT
BLADE CLIPPER SURG (BLADE) IMPLANT
BRUSH SCRUB EZ PLAIN DRY (MISCELLANEOUS) ×4 IMPLANT
COVER MAYO STAND STRL (DRAPES) IMPLANT
COVER SURGICAL LIGHT HANDLE (MISCELLANEOUS) ×2 IMPLANT
DRAPE C-ARM 42X72 X-RAY (DRAPES) ×2 IMPLANT
DRAPE C-ARMOR (DRAPES) ×2 IMPLANT
DRAPE HALF SHEET 40X57 (DRAPES) IMPLANT
DRAPE INCISE IOBAN 66X45 STRL (DRAPES) ×2 IMPLANT
DRAPE INCISE IOBAN 85X60 (DRAPES) ×2 IMPLANT
DRAPE ORTHO SPLIT 77X108 STRL (DRAPES) ×8
DRAPE SURG ORHT 6 SPLT 77X108 (DRAPES) ×4 IMPLANT
DRAPE U-SHAPE 47X51 STRL (DRAPES) ×2 IMPLANT
DRESSING MEPILEX FLEX 4X4 (GAUZE/BANDAGES/DRESSINGS) IMPLANT
DRILL BIT AO MATTA 2.5MX230M (BIT) ×4
DRILL STEP 3.5 (DRILL)
DRSG MEPILEX FLEX 4X4 (GAUZE/BANDAGES/DRESSINGS) ×2
DRSG MEPILEX POST OP 4X12 (GAUZE/BANDAGES/DRESSINGS) IMPLANT
DRSG MEPILEX POST OP 4X8 (GAUZE/BANDAGES/DRESSINGS) IMPLANT
DRSG MEPITEL 4X7.2 (GAUZE/BANDAGES/DRESSINGS) IMPLANT
ELECT BLADE 6.5 EXT (BLADE) ×2 IMPLANT
ELECT REM PT RETURN 9FT ADLT (ELECTROSURGICAL) ×2
ELECTRODE REM PT RTRN 9FT ADLT (ELECTROSURGICAL) ×2 IMPLANT
GAUZE SPONGE 4X4 12PLY STRL (GAUZE/BANDAGES/DRESSINGS) IMPLANT
GLOVE BIO SURGEON STRL SZ7.5 (GLOVE) ×2 IMPLANT
GLOVE BIO SURGEON STRL SZ8 (GLOVE) ×2 IMPLANT
GLOVE BIOGEL PI IND STRL 7.5 (GLOVE) ×2 IMPLANT
GLOVE BIOGEL PI IND STRL 8 (GLOVE) ×2 IMPLANT
GLOVE SURG ORTHO LTX SZ7.5 (GLOVE) ×4 IMPLANT
GOWN STRL REUS W/ TWL LRG LVL3 (GOWN DISPOSABLE) ×4 IMPLANT
GOWN STRL REUS W/ TWL XL LVL3 (GOWN DISPOSABLE) ×4 IMPLANT
GOWN STRL REUS W/TWL LRG LVL3 (GOWN DISPOSABLE) ×4
GOWN STRL REUS W/TWL XL LVL3 (GOWN DISPOSABLE) ×4
HANDPIECE INTERPULSE COAX TIP (DISPOSABLE)
IMMOBILIZER KNEE 22 UNIV (SOFTGOODS) IMPLANT
KIT BASIN OR (CUSTOM PROCEDURE TRAY) ×2 IMPLANT
KIT TURNOVER KIT B (KITS) ×2 IMPLANT
MANIFOLD NEPTUNE II (INSTRUMENTS) ×2 IMPLANT
NS IRRIG 1000ML POUR BTL (IV SOLUTION) ×2 IMPLANT
PACK TOTAL JOINT (CUSTOM PROCEDURE TRAY) ×2 IMPLANT
PAD ARMBOARD 7.5X6 YLW CONV (MISCELLANEOUS) ×4 IMPLANT
PIN APEX 6X180MM EXFIX (EXFIX) IMPLANT
PIN GUIDE DRIL TIP 2.8X300 STE (PIN) IMPLANT
PLATE ACET STRT 70.5M 6H (Plate) IMPLANT
PLATE ACET STRT 94.5M 8H (Plate) IMPLANT
PLATE SUPRAPECTINEAL PELVIS (Plate) IMPLANT
RETRIEVER SUT HEWSON (MISCELLANEOUS) ×2 IMPLANT
SCREW CANN 8.0X125X40 (Screw) IMPLANT
SCREW CANN 8X140X40 (Screw) IMPLANT
SCREW CORTEX ST MATTA 3.5X18MM (Screw) IMPLANT
SCREW CORTEX ST MATTA 3.5X20 (Screw) IMPLANT
SCREW CORTEX ST MATTA 3.5X22MM (Screw) IMPLANT
SCREW CORTEX ST MATTA 3.5X24 (Screw) IMPLANT
SCREW CORTEX ST MATTA 3.5X28MM (Screw) IMPLANT
SCREW CORTEX ST MATTA 3.5X30MM (Screw) IMPLANT
SCREW CORTEX ST MATTA 3.5X32MM (Screw) IMPLANT
SCREW CORTEX ST MATTA 3.5X34MM (Screw) IMPLANT
SCREW CORTEX ST MATTA 3.5X36MM (Screw) IMPLANT
SCREW CORTEX ST MATTA 3.5X38M (Screw) IMPLANT
SCREW CORTEX ST MATTA 3.5X40MM (Screw) IMPLANT
SET HNDPC FAN SPRY TIP SCT (DISPOSABLE) ×2 IMPLANT
SPONGE T-LAP 18X18 ~~LOC~~+RFID (SPONGE) IMPLANT
STAPLER VISISTAT 35W (STAPLE) ×2 IMPLANT
STRIP CLOSURE SKIN 1/2X4 (GAUZE/BANDAGES/DRESSINGS) ×2 IMPLANT
SUCTION TUBE FRAZIER 10FR DISP (SUCTIONS) ×2 IMPLANT
SUT ETHILON 2 0 PSLX (SUTURE) ×4 IMPLANT
SUT FIBERWIRE #2 38 T-5 BLUE (SUTURE) ×4
SUT VIC AB 0 CT1 27 (SUTURE) ×2
SUT VIC AB 0 CT1 27XBRD ANBCTR (SUTURE) ×2 IMPLANT
SUT VIC AB 1 CT1 18XCR BRD 8 (SUTURE) ×2 IMPLANT
SUT VIC AB 1 CT1 27 (SUTURE) ×2
SUT VIC AB 1 CT1 27XBRD ANBCTR (SUTURE) ×2 IMPLANT
SUT VIC AB 1 CT1 8-18 (SUTURE) ×2
SUT VIC AB 2-0 CT1 27 (SUTURE) ×2
SUT VIC AB 2-0 CT1 TAPERPNT 27 (SUTURE) ×2 IMPLANT
SUTURE FIBERWR #2 38 T-5 BLUE (SUTURE) ×4 IMPLANT
TOWEL GREEN STERILE (TOWEL DISPOSABLE) ×4 IMPLANT
TOWEL GREEN STERILE FF (TOWEL DISPOSABLE) ×4 IMPLANT
TRAY FOLEY MTR SLVR 16FR STAT (SET/KITS/TRAYS/PACK) IMPLANT
WATER STERILE IRR 1000ML POUR (IV SOLUTION) IMPLANT
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
## 2023-01-24 NOTE — Progress Notes (Addendum)
[facility] called to arrange for transport for pt for Ct Sim tomorrow. [facility] to have pt to Ct sim by 1130 am. Rn also called floor RN Toni Amend to make sure no special arrangements (tele, 02, etc). Toni Amend did state that pt had ORIF today. Rn passed this information along to [facility].

## 2023-01-24 NOTE — Progress Notes (Addendum)
Orthopaedic Trauma Service Post op Plan   R acetabulum fracture: T-type with posterior wall s/p ORIF anterior and posterior approach  Pelvic ring fracture with R sacrum diastasis s/p SI screws x 2   TDWB R leg x 8 weeks then Graduated WB thereafter   WBAT L leg for transfers x 4 weeks then progress  Posterior hip precautions R hip x 12 weeks    Knee immobilizer until more awake and understands precautions then dc    Pt will need XRT for HO prophylaxis.  Have communicated with Rad/Onc   Ice PRN R hip    Recommend anticoagulation x 4 week at dc   Mearl Latin, PA-C [phone] (C) 01/24/2023, 2:46 PM  [facility] [address] [place] [address] [phone] (F)        Patient ID: Patricia [name], female   DOB: [birth year], 23 y.o.   MRN: [id number]

## 2023-01-24 NOTE — Consult Note (Signed)
Brief Psychiatry Consult Note  Pt in OR for most of day for ORIF and removal of tampon; just now in PACU and not back to room. Psychiatry team unable to see today 6/17. Currently under IVC from ED with potential plan for inpt psych hospital when medically stable.  -- reassess tomorrow 6/18.    Patricia [name] A Mailin Coglianese

## 2023-01-24 NOTE — Transfer of Care (Signed)
Immediate Anesthesia Transfer of Care Note  Patient: Patricia [name]  Procedure(s) Performed: OPEN REDUCTION INTERNAL FIXATION (ORIF) ACETABULAR FRACTURE AND PELVIC RING FRACTURE (Right: Pelvis) REMOVAL FOREIGN BODY VAGINAL (Vagina )  Patient Location: PACU  Anesthesia Type:General  Level of Consciousness: drowsy and patient cooperative  Airway & Oxygen Therapy: Patient Spontanous Breathing and Patient connected to nasal cannula oxygen  Post-op Assessment: Report given to RN and Post -op Vital signs reviewed and stable  Post vital signs: Reviewed and stable  Last Vitals:  Vitals Value Taken Time  BP 131/67 01/24/23 1432  Temp    Pulse    Resp 11 01/24/23 1438  SpO2    Vitals shown include unvalidated device data.  Last Pain:  Vitals:   01/24/23 0725  TempSrc:   PainSc: 0-No pain         Complications: No notable events documented.
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
## 2023-01-24 NOTE — Anesthesia Procedure Notes (Signed)
Procedure Name: Intubation Date/Time: 01/24/2023 8:41 AM  Performed by: Audie Pinto, CRNAPre-anesthesia Checklist: Patient identified, Emergency Drugs available, Suction available and Patient being monitored Patient Re-evaluated:Patient Re-evaluated prior to induction Oxygen Delivery Method: Circle system utilized Preoxygenation: Pre-oxygenation with 100% oxygen Induction Type: IV induction Ventilation: Mask ventilation without difficulty Laryngoscope Size: Mac and 4 Grade View: Grade I Tube type: Oral Tube size: 7.0 mm Number of attempts: 1 Airway Equipment and Method: Stylet and Oral airway Placement Confirmation: ETT inserted through vocal cords under direct vision, positive ETCO2 and breath sounds checked- equal and bilateral Secured at: 21 cm Tube secured with: Tape Dental Injury: Teeth and Oropharynx as per pre-operative assessment

## 2023-01-24 NOTE — Anesthesia Preprocedure Evaluation (Signed)
Anesthesia Evaluation  Patient identified by MRN, date of birth, ID band Patient awake    Reviewed: Allergy & Precautions, H&P , NPO status , Patient's Chart, lab work & pertinent test results  Airway Mallampati: II   Neck ROM: full    Dental no notable dental hx.    Pulmonary neg pulmonary ROS, Patient abstained from smoking.   breath sounds clear to auscultation       Cardiovascular negative cardio ROS  Rhythm:regular Rate:Normal     Neuro/Psych  PSYCHIATRIC DISORDERS  Depression       GI/Hepatic   Endo/Other    Renal/GU      Musculoskeletal   Abdominal   Peds  Hematology   Anesthesia Other Findings   Reproductive/Obstetrics                             Anesthesia Physical Anesthesia Plan  ASA: 2  Anesthesia Plan: General   Post-op Pain Management: Tylenol PO (pre-op)*   Induction: Intravenous  PONV Risk Score and Plan: 3 and Ondansetron, Dexamethasone, Midazolam and Treatment may vary due to age or medical condition  Airway Management Planned: Oral ETT  Additional Equipment:   Intra-op Plan:   Post-operative Plan: Extubation in OR  Informed Consent: I have reviewed the patients History and Physical, chart, labs and discussed the procedure including the risks, benefits and alternatives for the proposed anesthesia with the patient or authorized representative who has indicated his/her understanding and acceptance.     Dental advisory given  Plan Discussed with: CRNA, Anesthesiologist and Surgeon  Anesthesia Plan Comments:        Anesthesia Quick Evaluation
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
## 2023-01-24 NOTE — Op Note (Addendum)
Operative Note   01/24/2023  PRE-OP DIAGNOSIS: Malodorous vaginal discharge. Concerned for retained tampon   POST-OP DIAGNOSIS: Retained tampon. Malodorous vaginal discharge  SURGEON:    Vergie Living, Billey Gosling, MD - Primary  ASSISTANT: None  PROCEDURE:  Exam under anesthesia, removal of retained tampon  ANESTHESIA: General  ESTIMATED BLOOD LOSS: none for my portion of the case  DRAINS: Per OR note  TOTAL IV FLUIDS: per OR note  SPECIMENS: vaginal swabs (gonorrhea, chlamydia, wet prep)  VTE PROPHYLAXIS: SCDs to the bilateral lower extremities  ANTIBIOTICS: per OR note. Per ortho, patient to be on 24 hours of antibiotics post op for their portion of the procedure.   COMPLICATIONS: none  DISPOSITION: PACU - hemodynamically stable.  CONDITION: stable  FINDINGS: Exam under anesthesia revealed foul smelling GU area with black brown discharge; EGBUS normal. On speculum exam, vaginal with same discharge and mass consistent with retained tampon (square shaped fabric like material that was approximatley  7cm in size, saturated with above discharge) was removed from the posterior fornix. No more discharge after this. Vagina and cervix wnl. No vaginal bleeding ever noted on exam.   PROCEDURE IN DETAIL:  After orthopedics finished their portion of the case, the patient was turned to dorsal supine and her legs held open and vaginal swabs obtained. The exam was done with the above noted findings.   Excellent hemostasis was noted, and all instruments were removed. Her care was then turned over to the Ortho OR The patient tolerated the procedure well.  Sponge, lap and instrument counts were correct x2.  The patient was taken to recovery room in excellent condition.  Cornelia Copa MD Attending [facility] [employer] [facility])

## 2023-01-25 ENCOUNTER — Ambulatory Visit
Admit: 2023-01-25 | Discharge: 2023-01-25 | Disposition: A | Discharge status: Home / Self Care | Payer: Medicaid Other | Attending: Radiation Oncology | Admitting: Radiation Oncology

## 2023-01-25 ENCOUNTER — Encounter: Payer: Self-pay | Admitting: Radiation Oncology

## 2023-01-25 ENCOUNTER — Other Ambulatory Visit: Payer: Self-pay

## 2023-01-25 DIAGNOSIS — M898X9 Other specified disorders of bone, unspecified site: Secondary | ICD-10-CM | POA: Insufficient documentation

## 2023-01-25 DIAGNOSIS — B9689 Other specified bacterial agents as the cause of diseases classified elsewhere: Secondary | ICD-10-CM | POA: Diagnosis present

## 2023-01-25 DIAGNOSIS — A749 Chlamydial infection, unspecified: Secondary | ICD-10-CM | POA: Diagnosis present

## 2023-01-25 DIAGNOSIS — N760 Acute vaginitis: Secondary | ICD-10-CM | POA: Diagnosis present

## 2023-01-25 DIAGNOSIS — T192XXA Foreign body in vulva and vagina, initial encounter: Secondary | ICD-10-CM | POA: Diagnosis present

## 2023-01-25 DIAGNOSIS — W448XXA Other foreign body entering into or through a natural orifice, initial encounter: Secondary | ICD-10-CM | POA: Diagnosis present

## 2023-01-25 LAB — CBC
HCT: 23.3 % — ABNORMAL LOW (ref 36.0–46.0)
HCT: 23.9 % — ABNORMAL LOW (ref 36.0–46.0)
Hemoglobin: 7.3 g/dL — ABNORMAL LOW (ref 12.0–15.0)
Hemoglobin: 7.3 g/dL — ABNORMAL LOW (ref 12.0–15.0)
MCH: 27.9 pg (ref 26.0–34.0)
MCH: 27.3 pg (ref 26.0–34.0)
MCHC: 31.3 g/dL (ref 30.0–36.0)
MCHC: 30.5 g/dL (ref 30.0–36.0)
MCV: 88.9 fL (ref 80.0–100.0)
MCV: 89.5 fL (ref 80.0–100.0)
Platelets: 241 10*3/uL (ref 150–400)
Platelets: 229 10*3/uL (ref 150–400)
RBC: 2.62 MIL/uL — ABNORMAL LOW (ref 3.87–5.11)
RBC: 2.67 MIL/uL — ABNORMAL LOW (ref 3.87–5.11)
RDW: 15.8 % — ABNORMAL HIGH (ref 11.5–15.5)
RDW: 15.7 % — ABNORMAL HIGH (ref 11.5–15.5)
WBC: 6 10*3/uL (ref 4.0–10.5)
WBC: 5.2 10*3/uL (ref 4.0–10.5)
nRBC: 0 % (ref 0.0–0.2)
nRBC: 0 % (ref 0.0–0.2)

## 2023-01-25 LAB — COMPREHENSIVE METABOLIC PANEL
ALT: 24 U/L (ref 0–44)
AST: 48 U/L — ABNORMAL HIGH (ref 15–41)
Albumin: 2.6 g/dL — ABNORMAL LOW (ref 3.5–5.0)
Alkaline Phosphatase: 39 U/L (ref 38–126)
Anion gap: 8 (ref 5–15)
BUN: <5 mg/dL — ABNORMAL LOW (ref 6–20)
CO2: 26 mmol/L (ref 22–32)
Calcium: 8 mg/dL — ABNORMAL LOW (ref 8.9–10.3)
Chloride: 102 mmol/L (ref 98–111)
Creatinine, Ser: 0.9 mg/dL (ref 0.44–1.00)
GFR, Estimated: >60 mL/min (ref >60)
Glucose, Bld: 143 mg/dL — ABNORMAL HIGH (ref 70–99)
Potassium: 3.2 mmol/L — ABNORMAL LOW (ref 3.5–5.1)
Sodium: 136 mmol/L (ref 135–145)
Total Bilirubin: 0.3 mg/dL (ref 0.3–1.2)
Total Protein: 5.1 g/dL — ABNORMAL LOW (ref 6.5–8.1)

## 2023-01-25 LAB — RAD ONC ARIA SESSION SUMMARY
Course Elapsed Days: 0
Plan Fractions Treated to Date: 1
Plan Total Fractions Prescribed: 1
Session Number: 1

## 2023-01-25 LAB — GC/CHLAMYDIA PROBE AMP (~~LOC~~) NOT AT ARMC
Chlamydia: POSITIVE — AB
Comment: NEGATIVE
Comment: NORMAL
Neisseria Gonorrhea: NEGATIVE

## 2023-01-25 LAB — VITAMIN D 25 HYDROXY (VIT D DEFICIENCY, FRACTURES): Vit D, 25-Hydroxy: 4.67 ng/mL — ABNORMAL LOW (ref 30–100)

## 2023-01-25 MED ORDER — METRONIDAZOLE 500 MG PO TABS
500.0000 mg | ORAL_TABLET | Freq: Two times a day (BID) | ORAL | Status: DC
  Administered 2023-01-25 – 2023-01-28 (×7): 500 mg via ORAL
  Filled 2023-01-25 (×7): qty 1

## 2023-01-25 MED ORDER — METHOCARBAMOL 1000 MG/10ML IJ SOLN: 500.0000 mg | Freq: Three times a day (TID) | INTRAVENOUS | Status: DC

## 2023-01-25 MED ORDER — POTASSIUM CHLORIDE 20 MEQ PO PACK
60.0000 meq | PACK | Freq: Once | ORAL | Status: AC
  Administered 2023-01-25: 60 meq via ORAL
  Filled 2023-01-25: qty 3

## 2023-01-25 MED ORDER — ENOXAPARIN SODIUM 30 MG/0.3ML IJ SOSY
30.0000 mg | PREFILLED_SYRINGE | Freq: Two times a day (BID) | INTRAMUSCULAR | Status: DC
  Administered 2023-01-26 – 2023-01-28 (×5): 30 mg via SUBCUTANEOUS
  Filled 2023-01-25 (×5): qty 0.3

## 2023-01-25 MED ORDER — POTASSIUM CHLORIDE CRYS ER 20 MEQ PO TBCR
40.0000 meq | EXTENDED_RELEASE_TABLET | Freq: Two times a day (BID) | ORAL | Status: DC
  Filled 2023-01-25: qty 2

## 2023-01-25 MED ORDER — FERROUS SULFATE 325 (65 FE) MG PO TABS
325.0000 mg | ORAL_TABLET | Freq: Two times a day (BID) | ORAL | Status: DC
  Administered 2023-01-25 – 2023-01-27 (×4): 325 mg via ORAL
  Filled 2023-01-25 (×4): qty 1

## 2023-01-25 MED ORDER — VITAMIN C 500 MG PO TABS
500.0000 mg | ORAL_TABLET | Freq: Two times a day (BID) | ORAL | Status: DC
  Administered 2023-01-25 – 2023-01-28 (×6): 500 mg via ORAL
  Filled 2023-01-25 (×6): qty 1

## 2023-01-25 MED ORDER — ENSURE ENLIVE PO LIQD
237.0000 mL | Freq: Two times a day (BID) | ORAL | Status: DC
  Administered 2023-01-25 – 2023-01-26 (×3): 237 mL via ORAL

## 2023-01-25 MED ORDER — METHOCARBAMOL 500 MG PO TABS
1000.0000 mg | ORAL_TABLET | Freq: Three times a day (TID) | ORAL | Status: AC
  Administered 2023-01-25 – 2023-01-27 (×7): 1000 mg via ORAL
  Filled 2023-01-25 (×9): qty 2

## 2023-01-25 MED ORDER — VITAMIN D (ERGOCALCIFEROL) 1.25 MG (50000 UNIT) PO CAPS
50000.0000 [IU] | ORAL_CAPSULE | ORAL | Status: DC
  Administered 2023-01-25: 50000 [IU] via ORAL
  Filled 2023-01-25: qty 1
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
## 2023-01-25 NOTE — Progress Notes (Signed)
? ?  Inpatient Rehab Admissions Coordinator : ? ?Per therapy recommendations, patient was screened for CIR candidacy by Cobey Raineri RN MSN.  At this time patient appears to be a potential candidate for CIR. I will place a rehab consult per protocol for full assessment. Please call me with any questions. ? ?Coal Nearhood RN MSN ?Admissions Coordinator ?[phone] ?  ?

## 2023-01-25 NOTE — Progress Notes (Signed)
PT Cancellation Note  Patient Details Name: Patricia [name] MRN: [id number] DOB: 2000/12/19   Cancelled Treatment:    Reason Eval/Treat Not Completed: Patient at procedure or test/unavailable. Will re-attempt at later time/date.    [name] 01/25/2023, 12:50 PM
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
## 2023-01-25 NOTE — Plan of Care (Signed)
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
## 2023-01-25 NOTE — Anesthesia Postprocedure Evaluation (Signed)
Anesthesia Post Note  Patient: Transport planner  Procedure(s) Performed: OPEN REDUCTION INTERNAL FIXATION (ORIF) ACETABULAR FRACTURE AND PELVIC RING FRACTURE (Right: Pelvis) REMOVAL FOREIGN BODY VAGINAL (Vagina )     Patient location during evaluation: PACU Anesthesia Type: General Level of consciousness: awake and alert Pain management: pain level controlled Vital Signs Assessment: post-procedure vital signs reviewed and stable Respiratory status: spontaneous breathing, nonlabored ventilation and respiratory function stable Cardiovascular status: blood pressure returned to baseline and stable Postop Assessment: no apparent nausea or vomiting Anesthetic complications: no   No notable events documented.  Last Vitals:  Vitals:   01/25/23 1341 01/25/23 1703  BP: 127/68 (!) 106/52  Pulse: 95 89  Resp: 18 17  Temp: 36.9 C 37.2 C  SpO2: 100% 97%    Last Pain:  Vitals:   01/25/23 1341  TempSrc: Oral  PainSc:    Pain Goal: Patients Stated Pain Goal: 3 (01/25/23 0223)                 Lowella Curb

## 2023-01-25 NOTE — Progress Notes (Signed)
GYN Note  Chlamydia swab came back positive. I discussed this with patient and recommend she let any partners know as well for free testing and treatment at the [facility].   Message sent to primary team regarding recommendations of:  Azithromycin 1gm PO x 1 Obtaining screening STD bloodwork of HIV, rpr, HepB surface antigen screening  Cornelia Copa MD Attending Center for [employer] (Faculty Practice) 01/25/2023 Time: [phone]

## 2023-01-25 NOTE — Progress Notes (Signed)
Patient is being transported to [facility] with [facility] administrator.
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
## 2023-01-26 ENCOUNTER — Encounter (HOSPITAL_COMMUNITY): Payer: Self-pay | Admitting: Orthopedic Surgery

## 2023-01-26 LAB — BASIC METABOLIC PANEL
Anion gap: 8 (ref 5–15)
BUN: <5 mg/dL — ABNORMAL LOW (ref 6–20)
CO2: 24 mmol/L (ref 22–32)
Calcium: 7.9 mg/dL — ABNORMAL LOW (ref 8.9–10.3)
Chloride: 102 mmol/L (ref 98–111)
Creatinine, Ser: 0.58 mg/dL (ref 0.44–1.00)
GFR, Estimated: >60 mL/min (ref >60)
Glucose, Bld: 78 mg/dL (ref 70–99)
Potassium: 3.5 mmol/L (ref 3.5–5.1)
Sodium: 134 mmol/L — ABNORMAL LOW (ref 135–145)

## 2023-01-26 LAB — CBC
HCT: 23.2 % — ABNORMAL LOW (ref 36.0–46.0)
Hemoglobin: 7.1 g/dL — ABNORMAL LOW (ref 12.0–15.0)
MCH: 28 pg (ref 26.0–34.0)
MCHC: 30.6 g/dL (ref 30.0–36.0)
MCV: 91.3 fL (ref 80.0–100.0)
Platelets: 244 10*3/uL (ref 150–400)
RBC: 2.54 MIL/uL — ABNORMAL LOW (ref 3.87–5.11)
RDW: 15.8 % — ABNORMAL HIGH (ref 11.5–15.5)
WBC: 5.2 10*3/uL (ref 4.0–10.5)
nRBC: 0 % (ref 0.0–0.2)

## 2023-01-26 LAB — RPR: RPR Ser Ql: NONREACTIVE

## 2023-01-26 LAB — HEPATITIS B SURFACE ANTIGEN: Hepatitis B Surface Ag: NONREACTIVE

## 2023-01-26 MED ORDER — AZITHROMYCIN 250 MG PO TABS
1000.0000 mg | ORAL_TABLET | Freq: Once | ORAL | Status: AC
  Administered 2023-01-26: 1000 mg via ORAL
  Filled 2023-01-26: qty 4

## 2023-01-26 MED ORDER — HYDROMORPHONE HCL 1 MG/ML IJ SOLN: 0.5000 mg | INTRAMUSCULAR | Status: DC | PRN

## 2023-01-26 MED ORDER — VITAMIN D 25 MCG (1000 UNIT) PO TABS
2000.0000 [IU] | ORAL_TABLET | Freq: Two times a day (BID) | ORAL | Status: DC
  Administered 2023-01-26 – 2023-01-28 (×4): 2000 [IU] via ORAL
  Filled 2023-01-26 (×4): qty 2
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
## 2023-01-26 NOTE — Progress Notes (Signed)
Inpatient Rehab Admissions Coordinator:    I met with Pt. And her mother at bedside to discuss potential CIR admit. Pt. Is agreeable to come to CIR if insurance approves. Her mother works in the evenings but states that Pt.'s adult brothers can be with Pt. Most of the time while she works. I will open case with Pt.'s insurance and pursue admit.    Megan Salon, MS, CCC-SLP Rehab Admissions Coordinator  [phone] (celll) [phone] (office)

## 2023-01-26 NOTE — Progress Notes (Signed)
  Radiation Oncology         [phone] ________________________________  Name: Patricia [name] MRN: [id number]  Date: 01/25/2023  DOB: 2000-11-20  End of Treatment Note  Diagnosis:   Postoperative heterotopic ossification     Indication for treatment:  Curative       Radiation treatment dates:   01/25/23  Site/dose:   The patient was treated to a dose of 7 Gy to the right hip. This was accomplished with AP and PA fields.  Narrative: The patient tolerated radiation treatment relatively well.     Plan: The patient has completed radiation treatment. The patient will return to radiation oncology clinic on an as needed basis.     Osker Mason, PAC
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
## 2023-01-26 NOTE — Plan of Care (Signed)
  Problem: Health Behavior/Discharge Planning: Goal: Ability to manage health-related needs will improve Outcome: Progressing   
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
## 2023-01-27 DIAGNOSIS — F1290 Cannabis use, unspecified, uncomplicated: Secondary | ICD-10-CM

## 2023-01-27 DIAGNOSIS — F1020 Alcohol dependence, uncomplicated: Secondary | ICD-10-CM

## 2023-01-27 DIAGNOSIS — F321 Major depressive disorder, single episode, moderate: Secondary | ICD-10-CM

## 2023-01-27 DIAGNOSIS — S32811S Multiple fractures of pelvis with unstable disruption of pelvic ring, sequela: Secondary | ICD-10-CM

## 2023-01-27 LAB — HIV ANTIBODY (ROUTINE TESTING W REFLEX): HIV Screen 4th Generation wRfx: NONREACTIVE

## 2023-01-27 LAB — CBC
HCT: 24.7 % — ABNORMAL LOW (ref 36.0–46.0)
Hemoglobin: 7.3 g/dL — ABNORMAL LOW (ref 12.0–15.0)
MCH: 26.6 pg (ref 26.0–34.0)
MCHC: 29.6 g/dL — ABNORMAL LOW (ref 30.0–36.0)
MCV: 90.1 fL (ref 80.0–100.0)
Platelets: 281 10*3/uL (ref 150–400)
RBC: 2.74 MIL/uL — ABNORMAL LOW (ref 3.87–5.11)
RDW: 15.9 % — ABNORMAL HIGH (ref 11.5–15.5)
WBC: 6.3 10*3/uL (ref 4.0–10.5)
nRBC: 0.3 % — ABNORMAL HIGH (ref 0.0–0.2)

## 2023-01-27 MED ORDER — POLYETHYLENE GLYCOL 3350 17 G PO PACK
17.0000 g | PACK | Freq: Every day | ORAL | Status: DC
  Filled 2023-01-27: qty 1

## 2023-01-27 MED ORDER — PRENATAL MULTIVITAMIN CH
1.0000 | ORAL_TABLET | Freq: Every day | ORAL | Status: DC
  Administered 2023-01-27 – 2023-01-28 (×2): 1 via ORAL
  Filled 2023-01-27 (×2): qty 1

## 2023-01-27 MED ORDER — SENNA 8.6 MG PO TABS
1.0000 | ORAL_TABLET | Freq: Every day | ORAL | Status: DC
  Administered 2023-01-27: 8.6 mg via ORAL
  Filled 2023-01-27: qty 1

## 2023-01-27 MED ORDER — MIRTAZAPINE 15 MG PO TABS
7.5000 mg | ORAL_TABLET | Freq: Every day | ORAL | Status: DC
  Administered 2023-01-27: 7.5 mg via ORAL
  Filled 2023-01-27: qty 1
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
## 2023-01-28 ENCOUNTER — Encounter (HOSPITAL_COMMUNITY): Payer: Self-pay | Admitting: Physical Medicine and Rehabilitation

## 2023-01-28 ENCOUNTER — Other Ambulatory Visit: Payer: Self-pay

## 2023-01-28 ENCOUNTER — Inpatient Hospital Stay (HOSPITAL_COMMUNITY)
Admission: RE | Admit: 2023-01-28 | Discharge: 2023-02-03 | DRG: 560 | Disposition: A | Discharge status: Home / Self Care | Payer: Medicaid Other | Source: Intra-hospital | Attending: Physical Medicine and Rehabilitation | Admitting: Physical Medicine and Rehabilitation

## 2023-01-28 DIAGNOSIS — Z9151 Personal history of suicidal behavior: Secondary | ICD-10-CM | POA: Diagnosis not present

## 2023-01-28 DIAGNOSIS — X810XXA Intentional self-harm by jumping or lying in front of motor vehicle, initial encounter: Secondary | ICD-10-CM

## 2023-01-28 DIAGNOSIS — S329XXD Fracture of unspecified parts of lumbosacral spine and pelvis, subsequent encounter for fracture with routine healing: Secondary | ICD-10-CM | POA: Diagnosis not present

## 2023-01-28 DIAGNOSIS — K5900 Constipation, unspecified: Secondary | ICD-10-CM | POA: Diagnosis not present

## 2023-01-28 DIAGNOSIS — W44F9XA Other object of natural or organic material, entering into or through a natural orifice, initial encounter: Secondary | ICD-10-CM | POA: Diagnosis present

## 2023-01-28 DIAGNOSIS — Z4789 Encounter for other orthopedic aftercare: Secondary | ICD-10-CM | POA: Diagnosis not present

## 2023-01-28 DIAGNOSIS — S32491S Other specified fracture of right acetabulum, sequela: Secondary | ICD-10-CM

## 2023-01-28 DIAGNOSIS — A749 Chlamydial infection, unspecified: Secondary | ICD-10-CM | POA: Diagnosis not present

## 2023-01-28 DIAGNOSIS — I959 Hypotension, unspecified: Secondary | ICD-10-CM | POA: Diagnosis not present

## 2023-01-28 DIAGNOSIS — Z887 Allergy status to serum and vaccine status: Secondary | ICD-10-CM

## 2023-01-28 DIAGNOSIS — S32810A Multiple fractures of pelvis with stable disruption of pelvic ring, initial encounter for closed fracture: Secondary | ICD-10-CM | POA: Diagnosis not present

## 2023-01-28 DIAGNOSIS — T192XXA Foreign body in vulva and vagina, initial encounter: Secondary | ICD-10-CM | POA: Diagnosis not present

## 2023-01-28 DIAGNOSIS — F419 Anxiety disorder, unspecified: Secondary | ICD-10-CM | POA: Diagnosis not present

## 2023-01-28 DIAGNOSIS — E871 Hypo-osmolality and hyponatremia: Secondary | ICD-10-CM | POA: Diagnosis present

## 2023-01-28 DIAGNOSIS — S3282XA Multiple fractures of pelvis without disruption of pelvic ring, initial encounter for closed fracture: Secondary | ICD-10-CM | POA: Diagnosis not present

## 2023-01-28 DIAGNOSIS — F32A Depression, unspecified: Secondary | ICD-10-CM | POA: Diagnosis not present

## 2023-01-28 DIAGNOSIS — R000 Tachycardia, unspecified: Secondary | ICD-10-CM | POA: Diagnosis not present

## 2023-01-28 DIAGNOSIS — D509 Iron deficiency anemia, unspecified: Secondary | ICD-10-CM | POA: Diagnosis present

## 2023-01-28 DIAGNOSIS — S32491D Other specified fracture of right acetabulum, subsequent encounter for fracture with routine healing: Secondary | ICD-10-CM | POA: Diagnosis not present

## 2023-01-28 DIAGNOSIS — S32401D Unspecified fracture of right acetabulum, subsequent encounter for fracture with routine healing: Secondary | ICD-10-CM | POA: Diagnosis not present

## 2023-01-28 DIAGNOSIS — F3341 Major depressive disorder, recurrent, in partial remission: Secondary | ICD-10-CM

## 2023-01-28 DIAGNOSIS — S32401A Unspecified fracture of right acetabulum, initial encounter for closed fracture: Secondary | ICD-10-CM | POA: Diagnosis not present

## 2023-01-28 DIAGNOSIS — S72001S Fracture of unspecified part of neck of right femur, sequela: Secondary | ICD-10-CM | POA: Diagnosis not present

## 2023-01-28 DIAGNOSIS — S72001A Fracture of unspecified part of neck of right femur, initial encounter for closed fracture: Principal | ICD-10-CM | POA: Diagnosis present

## 2023-01-28 DIAGNOSIS — S32401S Unspecified fracture of right acetabulum, sequela: Secondary | ICD-10-CM | POA: Diagnosis not present

## 2023-01-28 DIAGNOSIS — R531 Weakness: Secondary | ICD-10-CM | POA: Diagnosis present

## 2023-01-28 LAB — CBC
HCT: 22.7 % — ABNORMAL LOW (ref 36.0–46.0)
Hemoglobin: 6.9 g/dL — CL (ref 12.0–15.0)
MCH: 27.1 pg (ref 26.0–34.0)
MCHC: 30.4 g/dL (ref 30.0–36.0)
MCV: 89 fL (ref 80.0–100.0)
Platelets: 314 10*3/uL (ref 150–400)
RBC: 2.55 MIL/uL — ABNORMAL LOW (ref 3.87–5.11)
RDW: 16.3 % — ABNORMAL HIGH (ref 11.5–15.5)
WBC: 6 10*3/uL (ref 4.0–10.5)
nRBC: 0.3 % — ABNORMAL HIGH (ref 0.0–0.2)

## 2023-01-28 LAB — TYPE AND SCREEN

## 2023-01-28 LAB — PREPARE RBC (CROSSMATCH)

## 2023-01-28 LAB — BPAM RBC: Blood Product Expiration Date: 202407122359

## 2023-01-28 MED ORDER — OXYCODONE HCL 5 MG PO TABS
5.0000 mg | ORAL_TABLET | ORAL | Status: DC | PRN
  Administered 2023-01-29 – 2023-01-30 (×2): 5 mg via ORAL
  Filled 2023-01-28 (×2): qty 1

## 2023-01-28 MED ORDER — ACETAMINOPHEN 500 MG PO TABS
1000.0000 mg | ORAL_TABLET | Freq: Four times a day (QID) | ORAL | Status: DC
  Administered 2023-01-28 – 2023-02-02 (×13): 1000 mg via ORAL
  Filled 2023-01-28 (×20): qty 2

## 2023-01-28 MED ORDER — VITAMIN C 500 MG PO TABS
500.0000 mg | ORAL_TABLET | Freq: Two times a day (BID) | ORAL | Status: DC
  Administered 2023-01-28 – 2023-02-03 (×12): 500 mg via ORAL
  Filled 2023-01-28 (×12): qty 1

## 2023-01-28 MED ORDER — VITAMIN D (ERGOCALCIFEROL) 1.25 MG (50000 UNIT) PO CAPS
50000.0000 [IU] | ORAL_CAPSULE | ORAL | Status: DC
  Administered 2023-02-01: 50000 [IU] via ORAL
  Filled 2023-01-28: qty 1

## 2023-01-28 MED ORDER — ENSURE ENLIVE PO LIQD: 237.0000 mL | Freq: Two times a day (BID) | ORAL | Status: DC

## 2023-01-28 MED ORDER — METRONIDAZOLE 500 MG PO TABS
500.0000 mg | ORAL_TABLET | Freq: Two times a day (BID) | ORAL | Status: AC
  Administered 2023-01-28 – 2023-01-31 (×7): 500 mg via ORAL
  Filled 2023-01-28 (×7): qty 1

## 2023-01-28 MED ORDER — MAGNESIUM HYDROXIDE 400 MG/5ML PO SUSP
30.0000 mL | Freq: Once | ORAL | Status: DC
  Filled 2023-01-28: qty 30

## 2023-01-28 MED ORDER — POLYETHYLENE GLYCOL 3350 17 G PO PACK: 17.0000 g | PACK | Freq: Two times a day (BID) | ORAL | Status: DC

## 2023-01-28 MED ORDER — THIAMINE HCL 100 MG/ML IJ SOLN
100.0000 mg | Freq: Every day | INTRAMUSCULAR | Status: DC
  Filled 2023-01-28 (×2): qty 2

## 2023-01-28 MED ORDER — SODIUM CHLORIDE 0.9% IV SOLUTION: Freq: Once | INTRAVENOUS | Status: AC

## 2023-01-28 MED ORDER — ONDANSETRON HCL 4 MG PO TABS: 4.0000 mg | ORAL_TABLET | Freq: Four times a day (QID) | ORAL | Status: DC | PRN

## 2023-01-28 MED ORDER — SENNA 8.6 MG PO TABS
1.0000 | ORAL_TABLET | Freq: Every day | ORAL | Status: DC
  Administered 2023-01-29: 8.6 mg via ORAL
  Filled 2023-01-28 (×6): qty 1

## 2023-01-28 MED ORDER — MIRTAZAPINE 15 MG PO TABS
7.5000 mg | ORAL_TABLET | Freq: Every day | ORAL | Status: DC
  Administered 2023-01-28 – 2023-02-02 (×6): 7.5 mg via ORAL
  Filled 2023-01-28 (×6): qty 1

## 2023-01-28 MED ORDER — MUPIROCIN 2 % EX OINT
1.0000 | TOPICAL_OINTMENT | Freq: Two times a day (BID) | CUTANEOUS | Status: AC
  Filled 2023-01-28 (×2): qty 22

## 2023-01-28 MED ORDER — THIAMINE MONONITRATE 100 MG PO TABS
100.0000 mg | ORAL_TABLET | Freq: Every day | ORAL | Status: DC
  Administered 2023-01-29 – 2023-02-03 (×6): 100 mg via ORAL
  Filled 2023-01-28 (×6): qty 1

## 2023-01-28 MED ORDER — METHOCARBAMOL 500 MG PO TABS
500.0000 mg | ORAL_TABLET | Freq: Four times a day (QID) | ORAL | Status: DC | PRN
  Administered 2023-01-29: 500 mg via ORAL
  Filled 2023-01-28: qty 1

## 2023-01-28 MED ORDER — PRENATAL MULTIVITAMIN CH
1.0000 | ORAL_TABLET | Freq: Every day | ORAL | Status: DC
  Administered 2023-01-29 – 2023-02-02 (×5): 1 via ORAL
  Filled 2023-01-28 (×6): qty 1

## 2023-01-28 MED ORDER — ALUM & MAG HYDROXIDE-SIMETH 200-200-20 MG/5ML PO SUSP: 30.0000 mL | ORAL | Status: DC | PRN

## 2023-01-28 MED ORDER — VITAMIN D 25 MCG (1000 UNIT) PO TABS
2000.0000 [IU] | ORAL_TABLET | Freq: Two times a day (BID) | ORAL | Status: DC
  Administered 2023-01-28 – 2023-02-03 (×12): 2000 [IU] via ORAL
  Filled 2023-01-28 (×12): qty 2

## 2023-01-28 MED ORDER — ENOXAPARIN SODIUM 30 MG/0.3ML IJ SOSY
30.0000 mg | PREFILLED_SYRINGE | Freq: Two times a day (BID) | INTRAMUSCULAR | Status: DC
  Administered 2023-01-28 – 2023-02-02 (×11): 30 mg via SUBCUTANEOUS
  Filled 2023-01-28 (×10): qty 0.3

## 2023-01-28 MED ORDER — ENOXAPARIN SODIUM 30 MG/0.3ML IJ SOSY: 30.0000 mg | PREFILLED_SYRINGE | Freq: Two times a day (BID) | INTRAMUSCULAR | Status: DC

## 2023-01-28 MED ORDER — DOCUSATE SODIUM 100 MG PO CAPS
100.0000 mg | ORAL_CAPSULE | Freq: Two times a day (BID) | ORAL | Status: DC
  Administered 2023-01-28 – 2023-02-01 (×3): 100 mg via ORAL
  Filled 2023-01-28 (×11): qty 1

## 2023-01-28 MED ORDER — ONDANSETRON HCL 4 MG/2ML IJ SOLN: 4.0000 mg | Freq: Four times a day (QID) | INTRAMUSCULAR | Status: DC | PRN

## 2023-01-28 MED ORDER — GUAIFENESIN-DM 100-10 MG/5ML PO SYRP: 10.0000 mL | ORAL_SOLUTION | Freq: Four times a day (QID) | ORAL | Status: DC | PRN

## 2023-01-28 MED ORDER — DIPHENHYDRAMINE HCL 25 MG PO CAPS: 25.0000 mg | ORAL_CAPSULE | Freq: Four times a day (QID) | ORAL | Status: DC | PRN

## 2023-01-28 MED ORDER — POLYETHYLENE GLYCOL 3350 17 G PO PACK
17.0000 g | PACK | Freq: Two times a day (BID) | ORAL | Status: DC
  Administered 2023-01-30 – 2023-01-31 (×2): 17 g via ORAL
  Filled 2023-01-28 (×11): qty 1

## 2023-01-28 MED ORDER — BACITRACIN ZINC 500 UNIT/GM EX OINT
TOPICAL_OINTMENT | Freq: Two times a day (BID) | CUTANEOUS | Status: DC
  Administered 2023-01-29 – 2023-02-02 (×8): 31.5 via TOPICAL
  Filled 2023-01-28: qty 28.4
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
## 2023-01-28 NOTE — Progress Notes (Signed)
Inpatient Rehab Admissions Coordinator:    I have a CIR bed for this Pt. Today. RN to call report. Trauma service to order 1 unit PRBCs before transfer and Psychiatry to rescind IVC order today.   Pt in agreement to admit to CIR for an estimated 7-10 days with the goal of d/c home with family support (mother, adult brothers).   Megan Salon, MS, CCC-SLP Rehab Admissions Coordinator  [phone] (celll) [phone] (office)
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
## 2023-01-28 NOTE — Discharge Instructions (Signed)
From the date of your surgery 6/17 you have touch down weightbearing restrictions of your right leg for 8 weeks and and weightbearing as tolerated of your left leg for transfers for 4 weeks You will need to be on anticoagulation for 4 weeks from surgery

## 2023-01-28 NOTE — Progress Notes (Signed)
Inpatient Rehabilitation Admission Medication Review by a Pharmacist  A complete drug regimen review was completed for this patient to identify any potential clinically significant medication issues.  High Risk Drug Classes Is patient taking? Indication by Medication  Antipsychotic No   Anticoagulant Yes Lovenox - VTE prophylaxis  Antibiotic Yes Flagyl - retained tampon (removed in OR)  Opioid Yes Oxycodone - moderate/severe pain  Antiplatelet No   Hypoglycemics/insulin No   Vasoactive Medication No   Chemotherapy No   Other Yes Acetaminophen - pain Ascorbic acid - vitamin Bacitracin ointment - road rash Cholecalciferol- vitamin Docusate - constipation Mirtazpaine - depression Miralax - constipation Prenatal vitamin - vitamin Senna - constipation Thiamine - vitamin Ergocalciferol - vitamin Maalox - indigestion Benadryl - itching Robitussin DM - cough Robaxin - muscle spasms Zofran - nausea/vomiting      Type of Medication Issue Identified Description of Issue Recommendation(s)  Drug Interaction(s) (clinically significant)     Duplicate Therapy     Allergy     No Medication Administration End Date     Incorrect Dose     Additional Drug Therapy Needed     Significant med changes from prior encounter (inform family/care partners about these prior to discharge).    Other       Clinically significant medication issues were identified that warrant physician communication and completion of prescribed/recommended actions by midnight of the next day:  No  Name of provider notified for urgent issues identified:   Provider Method of Notification:     Pharmacist comments:   Time spent performing this drug regimen review (minutes):  25 minutes   Christoper Fabian, PharmD, BCPS Please see amion for complete clinical pharmacist phone list 01/28/2023 5:32 PM

## 2023-01-28 NOTE — Progress Notes (Signed)
Patient discharged to 4W-13. Report called and given to oncoming nurse, no additional questions and or concerns at this time. Blood transfusion given as ordered, patient tolerated well. Patient IV's remained intact as requested. Patient alert and transported to unit safely.
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
## 2023-01-28 NOTE — Progress Notes (Addendum)
PMR Admission Coordinator Pre-Admission Assessment   Patient: Patricia [name] is an 22 y.o., female MRN: [id number] DOB: 09-11-2000 Height: 5\' 2"  (157.5 cm) Weight: 60 kg   Insurance Information HMO:     PPO:      PCP:      IPA:      80/20:      OTHER:  PRIMARY: [employer]  Medicaid    Policy#: [id number]      Subscriber: pt CM Name:       Phone#: [phone]     Fax#: [phone] Pt. Approve for admission 6/20-6/27 with update due 3/29 Pre-Cert#: [id number]       Employer:  Benefits:  Phone #:      Name:  Eff. Date: 10/08/2022 to 10/07/2023     Deduct:       Out of Pocket Max:       Life Max:  CIR:       SNF:  Outpatient:      Co-Pay:  Home Health:       Co-Pay:  DME:      Co-Pay:  Providers: in network
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
## 2023-01-28 NOTE — Plan of Care (Signed)
  Problem: Consults Goal: RH GENERAL PATIENT EDUCATION Description: See Patient Education module for education specifics. Outcome: Adequate for Discharge   Problem: RH BOWEL ELIMINATION Goal: RH STG MANAGE BOWEL WITH ASSISTANCE Description: STG Manage Bowel with toileting Assistance. Outcome: Adequate for Discharge Goal: RH STG MANAGE BOWEL W/MEDICATION W/ASSISTANCE Description: STG Manage Bowel with Medication with mod I  Assistance. Outcome: Adequate for Discharge   Problem: RH BLADDER ELIMINATION Goal: RH STG MANAGE BLADDER WITH ASSISTANCE Description: STG Manage Bladder With toileting Assistance Outcome: Adequate for Discharge   Problem: RH SAFETY Goal: RH STG ADHERE TO SAFETY PRECAUTIONS W/ASSISTANCE/DEVICE Description: STG Adhere to Safety Precautions With cues Assistance/Device. Outcome: Adequate for Discharge   Problem: RH PAIN MANAGEMENT Goal: RH STG PAIN MANAGED AT OR BELOW PT'S PAIN GOAL Description: < 4 with prns Outcome: Adequate for Discharge   Problem: RH KNOWLEDGE DEFICIT GENERAL Goal: RH STG INCREASE KNOWLEDGE OF SELF CARE AFTER HOSPITALIZATION Description: Patient and mother will be able to manage care at discharge using educational resources indpendently Outcome: Adequate for Discharge

## 2023-01-28 NOTE — TOC CAGE-AID Note (Signed)
Transition of Care [facility]) - CAGE-AID Screening   Patient Details  Name: Patricia [name] MRN: [id number] Date of Birth: May 23, 2001   Judie Bonus, RN Phone Number: 01/28/2023, 6:53 AM   Clinical Narrative:  Pt reports occasional THC, etoh usage, denies tobacco. Pt declines the need for resources.   CAGE-AID Screening:    Have You Ever Felt You Ought to Cut Down on Your Drinking or Drug Use?: No Have People Annoyed You By Critizing Your Drinking Or Drug Use?: No Have You Felt Bad Or Guilty About Your Drinking Or Drug Use?: No Have You Ever Had a Drink or Used Drugs First Thing In The Morning to Steady Your Nerves or to Get Rid of a Hangover?: No CAGE-AID Score: 0  Substance Abuse Education Offered: Yes

## 2023-01-29 DIAGNOSIS — S32401D Unspecified fracture of right acetabulum, subsequent encounter for fracture with routine healing: Secondary | ICD-10-CM | POA: Diagnosis not present

## 2023-01-29 DIAGNOSIS — S32401S Unspecified fracture of right acetabulum, sequela: Secondary | ICD-10-CM | POA: Diagnosis not present

## 2023-01-29 LAB — COMPREHENSIVE METABOLIC PANEL
ALT: 19 U/L (ref 0–44)
AST: 25 U/L (ref 15–41)
Albumin: 2.6 g/dL — ABNORMAL LOW (ref 3.5–5.0)
Alkaline Phosphatase: 50 U/L (ref 38–126)
Anion gap: 10 (ref 5–15)
BUN: 8 mg/dL (ref 6–20)
CO2: 22 mmol/L (ref 22–32)
Calcium: 8.6 mg/dL — ABNORMAL LOW (ref 8.9–10.3)
Chloride: 103 mmol/L (ref 98–111)
Creatinine, Ser: 0.67 mg/dL (ref 0.44–1.00)
GFR, Estimated: >60 mL/min (ref >60)
Glucose, Bld: 83 mg/dL (ref 70–99)
Potassium: 3.7 mmol/L (ref 3.5–5.1)
Sodium: 135 mmol/L (ref 135–145)
Total Bilirubin: 0.8 mg/dL (ref 0.3–1.2)
Total Protein: 6.1 g/dL — ABNORMAL LOW (ref 6.5–8.1)

## 2023-01-29 LAB — TYPE AND SCREEN
ABO/RH(D): A POS
Antibody Screen: NEGATIVE
Unit division: 0

## 2023-01-29 LAB — CBC WITH DIFFERENTIAL/PLATELET
Abs Immature Granulocytes: 0.1 10*3/uL — ABNORMAL HIGH (ref 0.00–0.07)
Basophils Absolute: 0 10*3/uL (ref 0.0–0.1)
Basophils Relative: 1 %
Eosinophils Absolute: 0.3 10*3/uL (ref 0.0–0.5)
Eosinophils Relative: 4 %
HCT: 27.9 % — ABNORMAL LOW (ref 36.0–46.0)
Hemoglobin: 8.5 g/dL — ABNORMAL LOW (ref 12.0–15.0)
Immature Granulocytes: 2 %
Lymphocytes Relative: 20 %
Lymphs Abs: 1.4 10*3/uL (ref 0.7–4.0)
MCH: 27 pg (ref 26.0–34.0)
MCHC: 30.5 g/dL (ref 30.0–36.0)
MCV: 88.6 fL (ref 80.0–100.0)
Monocytes Absolute: 0.6 10*3/uL (ref 0.1–1.0)
Monocytes Relative: 9 %
Neutro Abs: 4.4 10*3/uL (ref 1.7–7.7)
Neutrophils Relative %: 64 %
Platelets: 387 10*3/uL (ref 150–400)
RBC: 3.15 MIL/uL — ABNORMAL LOW (ref 3.87–5.11)
RDW: 16.6 % — ABNORMAL HIGH (ref 11.5–15.5)
WBC: 6.8 10*3/uL (ref 4.0–10.5)
nRBC: 0.3 % — ABNORMAL HIGH (ref 0.0–0.2)

## 2023-01-29 LAB — MAGNESIUM: Magnesium: 1.8 mg/dL (ref 1.7–2.4)

## 2023-01-29 LAB — BPAM RBC
ISSUE DATE / TIME: 202406211316
Unit Type and Rh: 6200

## 2023-01-29 MED ORDER — MELATONIN 5 MG PO TABS
5.0000 mg | ORAL_TABLET | Freq: Every evening | ORAL | Status: DC | PRN
  Administered 2023-01-29: 5 mg via ORAL
  Filled 2023-01-29: qty 1
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
## 2023-01-29 NOTE — Progress Notes (Signed)
Appears to be resting comfortable during the shift in no acute distress, respiration even and steady, coloration adequate. Refused Tylenol dose at midnight per her request she just wanted to allowed to sleep,Assisted as needed. Continue to monitor

## 2023-01-29 NOTE — Progress Notes (Signed)
PROGRESS NOTE   Subjective/Complaints: Nursing states patient would like IV's removed, discussed with nursing that this would be ok Patient asks for ground pass  ROS: does not like nutritional supplements Objective:   No results found. Recent Labs    01/28/23 0649 01/29/23 0700  WBC 6.0 6.8  HGB 6.9* 8.5*  HCT 22.7* 27.9*  PLT 314 387   Recent Labs    01/29/23 0700  NA 135  K 3.7  CL 103  CO2 22  GLUCOSE 83  BUN 8  CREATININE 0.67  CALCIUM 8.6*    Intake/Output Summary (Last 24 hours) at 01/29/2023 1432 Last data filed at 01/29/2023 0742 Gross per 24 hour  Intake 438 ml  Output 400 ml  Net 38 ml        Physical Exam: Vital Signs Blood pressure 122/72, pulse 87, temperature 98.5 F (36.9 C), height 5\' 2"  (1.575 m), weight 58.5 kg, last menstrual period 01/08/2023, SpO2 100 %. Gen: no distress, normal appearing HEENT: oral mucosa pink and moist, NCAT Cardio: Reg rate Chest: normal effort, normal rate of breathing Abd: soft, non-distended Ext: no edema Psych: pleasant, normal affect Skin: Multiple bilateral lower extremity abrasions, covered in Mepilex, no apparent drainage. MSK:      No apparent deformity.      Strength:                RUE: 5/5 SA, 5/5 EF, 5/5 EE, 5/5 WE, 5/5 FF, 5/5 FA                 LUE: 5/5 SA, 5/5 EF, 5/5 EE, 5/5 WE, 5/5 FF, 5/5 FA                 RLE: 1/5 HF, 3/5 KE, 5/5 DF, 5/5 EHL, 5/5 PF -limited by pain                LLE:  2/5 HF, 4/5 KE, 5/5 DF, 5/5 EHL, 5/5 PF -limited by pain   Neurologic exam:  Cognition: AAO to person, place, time Memory: Difficulty recalling events leading to hospitalization, states it is "flashes" Insight: Good  insight into current condition.  Mood: Pleasant affect, appropriate mood.  No current SI. Sensation: To light touch intact in BL UEs and LEs  Reflexes: 2+ in BL UE and LEs.  CN: 2-12 grossly intact.    Assessment/Plan: 1. Functional  deficits which require 3+ hours per day of interdisciplinary therapy in a comprehensive inpatient rehab setting. Physiatrist is providing close team supervision and 24 hour management of active medical problems listed below. Physiatrist and rehab team continue to assess barriers to discharge/monitor patient progress toward functional and medical goals  Care Tool:  Bathing    Body parts bathed by patient: Right arm, Left arm, Chest, Abdomen, Front perineal area, Right upper leg, Left upper leg, Face   Body parts bathed by helper: Buttocks, Right lower leg, Left lower leg     Bathing assist Assist Level: Maximal Assistance - Patient 24 - 49%     Upper Body Dressing/Undressing Upper body dressing   What is the patient wearing?: Pull over shirt    Upper body assist Assist Level:  Contact Guard/Touching assist    Lower Body Dressing/Undressing Lower body dressing      What is the patient wearing?: Underwear/pull up, Pants     Lower body assist Assist for lower body dressing: Moderate Assistance - Patient 50 - 74%     Toileting Toileting    Toileting assist Assist for toileting: Minimal Assistance - Patient > 75%     Transfers Chair/bed transfer  Transfers assist     Chair/bed transfer assist level: Minimal Assistance - Patient > 75%     Locomotion Ambulation   Ambulation assist   Ambulation activity did not occur: Safety/medical concerns          Walk 10 feet activity   Assist  Walk 10 feet activity did not occur: Safety/medical concerns        Walk 50 feet activity   Assist Walk 50 feet with 2 turns activity did not occur: Safety/medical concerns         Walk 150 feet activity   Assist Walk 150 feet activity did not occur: Safety/medical concerns         Walk 10 feet on uneven surface  activity   Assist Walk 10 feet on uneven surfaces activity did not occur: Safety/medical concerns         Wheelchair     Assist Is the  patient using a wheelchair?: Yes Type of Wheelchair: Manual    Wheelchair assist level: Supervision/Verbal cueing Max wheelchair distance: 150    Wheelchair 50 feet with 2 turns activity    Assist        Assist Level: Supervision/Verbal cueing   Wheelchair 150 feet activity     Assist      Assist Level: Supervision/Verbal cueing   Blood pressure 122/72, pulse 87, temperature 98.5 F (36.9 C), height 5\' 2"  (1.575 m), weight 58.5 kg, last menstrual period 01/08/2023, SpO2 100 %.  Medical Problem List and Plan: 1. Functional deficits secondary to pelvic fracture s/p pelvic screw fixation and ORIF by Dr. Carola Frost 6/17             -patient may shower with surgical site covered             -ELOS/Goals: 10 to 14 days, modified independent PT/OT             - TDWB on the RLE and WBAT on the LLE   Grounds pass ordered 2.  Antithrombotics: -DVT/anticoagulation:  Pharmaceutical: Lovenox 30 mg BID             -antiplatelet therapy: none             -ortho recs: anticoagulation for 4 weeks   3. Pain Management: continue Tylenol scheduled; Robaxin and oxycodone as needed   4. Mood/Behavior/Sleep: LCSW to evaluate and provide emotional support             -antipsychotic agents: n/a             -continue Remeron 7.5 mg q HS (depression, sedation, appetite stim)             - IVC lifted 6/21; no current restraints needed.  Recommend neuropsych consult with Dr. Kieth Brightly   5. Neuropsych/cognition: This patient is capable of making decisions on her own behalf.   6. Skin/Wound Care: Routine skin care checks             -monitor surgical incisions>>change Meplilex dressings daily prn             -Bacitacin  for road rash   7. Fluids/Electrolytes/Nutrition: Routine Is and Os and follow-up chemistries             -continue Vitamin D, B1, C, D3, MVI   8: Vitamin D deficiency: continue supplementation   9: Anemia; acute/chronic blood loss and history of iron deficiency              -s/p 1 unit PRBCs 6/21             -follow-up CBC   10: Right hip dislocation and acetabular fracture s/p closed reduction, traction; screw fixation, ORIF Dr. Carola Frost 6/17             -TDWB R leg x 8 weeks then graduated WB thereafter              -WBAT L leg for transfers x 4 weeks then progress             -bed to chair transfers only for the first 4 weeks post op              -posterior hip precautions R hip x 12 weeks              -no return to work for ~2 months   11: Retained vaginal tampon/Chlamydia infection; azithromycin 1 g given             -continue flagyl 500 mg BID for 7 days (end 6/25)   12: Hyponatremia: reviewed and has normalized   13: Alcohol use: cessation counseling, continue thiamine.   14. Tachycardia: flowsheet reviewed and has resolved.     LOS: 1 days A FACE TO FACE EVALUATION WAS PERFORMED  Horton Chin 01/29/2023, 2:32 PM

## 2023-01-29 NOTE — Plan of Care (Signed)
  Problem: RH Balance Goal: LTG: Patient will maintain dynamic sitting balance (OT) Description: LTG:  Patient will maintain dynamic sitting balance with assistance during activities of daily living (OT) Flowsheets (Taken 01/29/2023 1212) LTG: Pt will maintain dynamic sitting balance during ADLs with: Independent with assistive device Goal: LTG Patient will maintain dynamic standing with ADLs (OT) Description: LTG:  Patient will maintain dynamic standing balance with assist during activities of daily living (OT)  Flowsheets (Taken 01/29/2023 1212) LTG: Pt will maintain dynamic standing balance during ADLs with: Supervision/Verbal cueing   Problem: Sit to Stand Goal: LTG:  Patient will perform sit to stand in prep for activites of daily living with assistance level (OT) Description: LTG:  Patient will perform sit to stand in prep for activites of daily living with assistance level (OT) Flowsheets (Taken 01/29/2023 1212) LTG: PT will perform sit to stand in prep for activites of daily living with assistance level: Supervision/Verbal cueing   Problem: RH Grooming Goal: LTG Patient will perform grooming w/assist,cues/equip (OT) Description: LTG: Patient will perform grooming with assist, with/without cues using equipment (OT) Flowsheets (Taken 01/29/2023 1212) LTG: Pt will perform grooming with assistance level of: Independent with assistive device    Problem: RH Bathing Goal: LTG Patient will bathe all body parts with assist levels (OT) Description: LTG: Patient will bathe all body parts with assist levels (OT) Flowsheets (Taken 01/29/2023 1212) LTG: Pt will perform bathing with assistance level/cueing: Supervision/Verbal cueing   Problem: RH Dressing Goal: LTG Patient will perform upper body dressing (OT) Description: LTG Patient will perform upper body dressing with assist, with/without cues (OT). Flowsheets (Taken 01/29/2023 1212) LTG: Pt will perform upper body dressing with assistance  level of: Independent with assistive device Goal: LTG Patient will perform lower body dressing w/assist (OT) Description: LTG: Patient will perform lower body dressing with assist, with/without cues in positioning using equipment (OT) Flowsheets (Taken 01/29/2023 1212) LTG: Pt will perform lower body dressing with assistance level of: Supervision/Verbal cueing   Problem: RH Toileting Goal: LTG Patient will perform toileting task (3/3 steps) with assistance level (OT) Description: LTG: Patient will perform toileting task (3/3 steps) with assistance level (OT)  Flowsheets (Taken 01/29/2023 1212) LTG: Pt will perform toileting task (3/3 steps) with assistance level: Independent with assistive device   Problem: RH Toilet Transfers Goal: LTG Patient will perform toilet transfers w/assist (OT) Description: LTG: Patient will perform toilet transfers with assist, with/without cues using equipment (OT) Flowsheets (Taken 01/29/2023 1212) LTG: Pt will perform toilet transfers with assistance level of: Supervision/Verbal cueing   Problem: RH Tub/Shower Transfers Goal: LTG Patient will perform tub/shower transfers w/assist (OT) Description: LTG: Patient will perform tub/shower transfers with assist, with/without cues using equipment (OT) Flowsheets (Taken 01/29/2023 1212) LTG: Pt will perform tub/shower stall transfers with assistance level of: Supervision/Verbal cueing   Problem: RH Furniture Transfers Goal: LTG Patient will perform furniture transfers w/assist (OT/PT) Description: LTG: Patient will perform furniture transfers  with assistance (OT/PT). Flowsheets (Taken 01/29/2023 1212) LTG: Pt will perform furniture transfers with assist:: Supervision/Verbal cueing
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
## 2023-01-30 DIAGNOSIS — S32401S Unspecified fracture of right acetabulum, sequela: Secondary | ICD-10-CM | POA: Diagnosis not present

## 2023-01-30 MED ORDER — MAGNESIUM GLUCONATE 500 MG PO TABS
250.0000 mg | ORAL_TABLET | Freq: Every day | ORAL | Status: DC
  Administered 2023-01-30 – 2023-01-31 (×2): 250 mg via ORAL
  Filled 2023-01-30 (×2): qty 1

## 2023-01-30 NOTE — Progress Notes (Signed)
PROGRESS NOTE   Subjective/Complaints: No new complaints this morning Enjoyed grounds pass with friend yesterday Patient's chart reviewed- No issues reported overnight Vitals signs stable except for mild tachycardia  ROS: does not like nutritional supplements, pain is well controlled Objective:   No results found. Recent Labs    01/28/23 0649 01/29/23 0700  WBC 6.0 6.8  HGB 6.9* 8.5*  HCT 22.7* 27.9*  PLT 314 387   Recent Labs    01/29/23 0700  NA 135  K 3.7  CL 103  CO2 22  GLUCOSE 83  BUN 8  CREATININE 0.67  CALCIUM 8.6*    Intake/Output Summary (Last 24 hours) at 01/30/2023 1538 Last data filed at 01/30/2023 1327 Gross per 24 hour  Intake 356 ml  Output --  Net 356 ml        Physical Exam: Vital Signs Blood pressure 108/80, pulse (!) 102, temperature 98.9 F (37.2 C), temperature source Oral, resp. rate 18, height 5\' 2"  (1.575 m), weight 58.5 kg, last menstrual period 01/08/2023, SpO2 100 %. Gen: no distress, normal appearing HEENT: oral mucosa pink and moist, NCAT Cardio: Tachycardia Chest: normal effort, normal rate of breathing Abd: soft, non-distended Ext: no edema Psych: pleasant, normal affect Skin: Multiple bilateral lower extremity abrasions, covered in Mepilex, no apparent drainage. MSK:      No apparent deformity.      Strength:                RUE: 5/5 SA, 5/5 EF, 5/5 EE, 5/5 WE, 5/5 FF, 5/5 FA                 LUE: 5/5 SA, 5/5 EF, 5/5 EE, 5/5 WE, 5/5 FF, 5/5 FA                 RLE: 1/5 HF, 3/5 KE, 5/5 DF, 5/5 EHL, 5/5 PF -limited by pain                LLE:  2/5 HF, 4/5 KE, 5/5 DF, 5/5 EHL, 5/5 PF -limited by pain   Neurologic exam:  Cognition: AAO to person, place, time Memory: Difficulty recalling events leading to hospitalization, states it is "flashes" Insight: Good  insight into current condition.  Mood: Pleasant affect, appropriate mood.  No current SI. Sensation: To light  touch intact in BL UEs and LEs  Reflexes: 2+ in BL UE and LEs.  CN: 2-12 grossly intact.    Assessment/Plan: 1. Functional deficits which require 3+ hours per day of interdisciplinary therapy in a comprehensive inpatient rehab setting. Physiatrist is providing close team supervision and 24 hour management of active medical problems listed below. Physiatrist and rehab team continue to assess barriers to discharge/monitor patient progress toward functional and medical goals  Care Tool:  Bathing    Body parts bathed by patient: Right arm, Left arm, Chest, Abdomen, Front perineal area, Right upper leg, Left upper leg, Face   Body parts bathed by helper: Buttocks, Right lower leg, Left lower leg     Bathing assist Assist Level: Maximal Assistance - Patient 24 - 49%     Upper Body Dressing/Undressing Upper body dressing   What is  the patient wearing?: Pull over shirt    Upper body assist Assist Level: Contact Guard/Touching assist    Lower Body Dressing/Undressing Lower body dressing      What is the patient wearing?: Underwear/pull up, Pants     Lower body assist Assist for lower body dressing: Moderate Assistance - Patient 50 - 74%     Toileting Toileting    Toileting assist Assist for toileting: Minimal Assistance - Patient > 75%     Transfers Chair/bed transfer  Transfers assist     Chair/bed transfer assist level: Minimal Assistance - Patient > 75%     Locomotion Ambulation   Ambulation assist   Ambulation activity did not occur: Safety/medical concerns          Walk 10 feet activity   Assist  Walk 10 feet activity did not occur: Safety/medical concerns        Walk 50 feet activity   Assist Walk 50 feet with 2 turns activity did not occur: Safety/medical concerns         Walk 150 feet activity   Assist Walk 150 feet activity did not occur: Safety/medical concerns         Walk 10 feet on uneven surface  activity   Assist  Walk 10 feet on uneven surfaces activity did not occur: Safety/medical concerns         Wheelchair     Assist Is the patient using a wheelchair?: Yes Type of Wheelchair: Manual    Wheelchair assist level: Supervision/Verbal cueing Max wheelchair distance: 150    Wheelchair 50 feet with 2 turns activity    Assist        Assist Level: Supervision/Verbal cueing   Wheelchair 150 feet activity     Assist      Assist Level: Supervision/Verbal cueing   Blood pressure 108/80, pulse (!) 102, temperature 98.9 F (37.2 C), temperature source Oral, resp. rate 18, height 5\' 2"  (1.575 m), weight 58.5 kg, last menstrual period 01/08/2023, SpO2 100 %.  Medical Problem List and Plan: 1. Functional deficits secondary to pelvic fracture s/p pelvic screw fixation and ORIF by Dr. Carola Frost 6/17             -patient may shower with surgical site covered             -ELOS/Goals: 10 to 14 days, modified independent PT/OT             - TDWB on the RLE and WBAT on the LLE   Grounds pass ordered 2.  Antithrombotics: -DVT/anticoagulation:  Pharmaceutical: Lovenox 30 mg BID             -antiplatelet therapy: none             -ortho recs: anticoagulation for 4 weeks   3. Pain Management: continue Tylenol scheduled; Robaxin and oxycodone as needed   4. Mood/Behavior/Sleep: LCSW to evaluate and provide emotional support             -antipsychotic agents: n/a             -continue Remeron 7.5 mg q HS (depression, sedation, appetite stim)             - IVC lifted 6/21; no current restraints needed.  Recommend neuropsych consult with Dr. Kieth Brightly   5. Neuropsych/cognition: This patient is capable of making decisions on her own behalf.   6. Skin/Wound Care: Routine skin care checks             -  monitor surgical incisions>>change Meplilex dressings daily prn             -Bacitacin for road rash   7. Fluids/Electrolytes/Nutrition: Routine Is and Os and follow-up chemistries              -continue Vitamin D, B1, C, D3, MVI   8: Vitamin D deficiency: continue supplementation   9: Anemia; acute/chronic blood loss and history of iron deficiency             -s/p 1 unit PRBCs 6/21             -follow-up CBC   10: Right hip dislocation and acetabular fracture s/p closed reduction, traction; screw fixation, ORIF Dr. Carola Frost 6/17             -TDWB R leg x 8 weeks then graduated WB thereafter              -WBAT L leg for transfers x 4 weeks then progress             -bed to chair transfers only for the first 4 weeks post op              -posterior hip precautions R hip x 12 weeks              -no return to work for ~2 months   11: Retained vaginal tampon/Chlamydia infection; azithromycin 1 g given             -continue flagyl 500 mg BID for 7 days (end 6/25)   12: Hyponatremia: reviewed and has normalized. Monitor on Mondays.   13: Alcohol use: cessation counseling, continue thiamine. Start magnesium gluconate 250mg  HS.   14. Tachycardia: start magnesium gluconate 250mg  HS  15. Hypocalcemia: continue prenatal multivitamin    LOS: 2 days A FACE TO FACE EVALUATION WAS PERFORMED  Drema Pry Cletis Clack 01/30/2023, 3:38 PM

## 2023-01-31 DIAGNOSIS — S32401A Unspecified fracture of right acetabulum, initial encounter for closed fracture: Secondary | ICD-10-CM

## 2023-01-31 LAB — CBC
HCT: 32.8 % — ABNORMAL LOW (ref 36.0–46.0)
Hemoglobin: 9.9 g/dL — ABNORMAL LOW (ref 12.0–15.0)
MCH: 27 pg (ref 26.0–34.0)
MCHC: 30.2 g/dL (ref 30.0–36.0)
MCV: 89.6 fL (ref 80.0–100.0)
Platelets: 586 10*3/uL — ABNORMAL HIGH (ref 150–400)
RBC: 3.66 MIL/uL — ABNORMAL LOW (ref 3.87–5.11)
RDW: 17 % — ABNORMAL HIGH (ref 11.5–15.5)
WBC: 7.5 10*3/uL (ref 4.0–10.5)
nRBC: 0 % (ref 0.0–0.2)

## 2023-01-31 LAB — BASIC METABOLIC PANEL
Anion gap: 10 (ref 5–15)
BUN: 7 mg/dL (ref 6–20)
CO2: 22 mmol/L (ref 22–32)
Calcium: 9.4 mg/dL (ref 8.9–10.3)
Chloride: 104 mmol/L (ref 98–111)
Creatinine, Ser: 0.67 mg/dL (ref 0.44–1.00)
GFR, Estimated: >60 mL/min (ref >60)
Glucose, Bld: 88 mg/dL (ref 70–99)
Potassium: 3.8 mmol/L (ref 3.5–5.1)
Sodium: 136 mmol/L (ref 135–145)

## 2023-01-31 MED ORDER — MELATONIN 5 MG PO TABS
5.0000 mg | ORAL_TABLET | Freq: Every day | ORAL | Status: DC
  Administered 2023-01-31 – 2023-02-02 (×3): 5 mg via ORAL
  Filled 2023-01-31 (×3): qty 1

## 2023-01-31 MED ORDER — METHOCARBAMOL 750 MG PO TABS
750.0000 mg | ORAL_TABLET | Freq: Every day | ORAL | Status: DC
  Administered 2023-01-31 – 2023-02-02 (×3): 750 mg via ORAL
  Filled 2023-01-31 (×3): qty 1

## 2023-01-31 NOTE — Progress Notes (Signed)
Physical Therapy Session Note  Patient Details  Name: Patricia [name] MRN: [id number] Date of Birth: [birth year]  Today's Date: 01/31/2023 PT Individual Time: 7829-5621 + 1300-1415 PT Individual Time Calculation (min): 56 min  and Today's Date: 01/31/2023 PT Missed Time: 17 Minutes Missed Time Reason: Nursing care + 75 min  Short Term Goals: Week 1:  PT Short Term Goal 1 (Week 1): STG = LTGS 2/2 elos  Skilled Therapeutic Interventions/Progress Updates:       1st session: IV team and RN at bedside. RN reports patient is requesting to use the bathroom with female assistance. PT will return and make up missed time as able. She missed the first 17 minutes of therapy session.    Returned at [phone] and patient in wheelchair in her room. Patient agreeable to therapy session. No significant pain reported. Able to recall 3/3 posterior hip precautions. Pt operates manual wheelchair at supervision level. Propels herself using BUE hallway distances (>16ft) without assist, able to lock/unlock brakes without cues. Pt demonstrated squat<>pivot transfers from w/c to mat table, CGA for safety for both directions. Pt having difficulty complying with TDWB precautions on RLE and educated on importance of complying. Showed patient XR results (post-op) to help with understanding importance of WB restrictions.   Assisted into supine position with minA for RLE support. Completed below documented exercises, 1x20 reps each.    Access Code: [email or web address] URL: [email or web address] Date: 01/31/2023 Prepared by: Wynelle Link  Exercises - Supine Ankle Pumps  - 1 x daily - 7 x weekly - 3 sets - 15 reps - Supine Quad Set  - 1 x daily - 7 x weekly - 3 sets - 15 reps - Supine Isometric Hip Adduction with Pillow at Knees  - 1 x daily - 7 x weekly - 3 sets - 15 reps - Supine Gluteal Sets  - 1 x daily - 7 x weekly - 3 sets - 15 reps - Supine Heel Slides  - 1 x daily - 7 x weekly - 3 sets - 15 reps - Supine  Hip Abduction  - 1 x daily - 7 x weekly - 3 sets - 15 reps - Supine Active Straight Leg Raise  - 1 x daily - 7 x weekly - 3 sets - 15 reps - Supine Short Arc Quad  - 1 x daily - 7 x weekly - 3 sets - 15 reps  MinA for sit>supine for trunk support and RLE management. Completed lateral scoot/squat pivot transfer from mat table to w/c, towards her weaker R side. Educated on setup to optimize efficiency and limit weight bearing through RLE.   Pt propelled herself back to her room with supervision at wheelchair level. Ended session seated in w/c with NT in room. All needs met. Aware of upcoming therapy schedule.      2nd session: Pt in bed, FaceTiming a friend on her phone. Pt agreeable to therapy and denies any significant pain. Bed mobility completed without assist with HOB elevated. Lateral scoot transfer into the wheelchair with CGA. Pt propelled herself to main rehab gym at supervision level, ~143ft.   Problem solved stairs as these are the primary barrier to DC. Pt reports 4-5 steps to enter her home with 1 railing on the L. Reviewed either bumping up/down at wheelchair level vs using the shower chair method to navigate stairs. Patient was dependently bumped up and down x4, 6" steps, with +2 assist for safety. Pt reports her brother's (22 and 16 y.o) would be able to  assist with this. She then completed the same stairs via shower chair method with PT assisting managing shoewr chair, patient was able to go up/down x8 steps without physical assist - cues for complying with WB restrictions only.   Completed Car transfer with car height replicating a typical sedan. Worked on having her remove leg rests herself to become more independent. Able to complete car transfer via lateral scoot without physical assist - pt reports less painful then when she 1st tried this.   Transported outside for fresh air and to complete wheelchair level there-ex for UE strengthening using red TB (tricep ext, overhead press,  horizzontal abduction). Worked on Journalist, newspaper and educating her on follow up therapy (OPPT) once WB restrictions are lifted. Also asked her to get measurements for home (doorways and stairs). Discussed likely DC towards the end of the week and patient excited.   Returned to her room and ended the session seated in w/c with all needs met.      Therapy Documentation Precautions:  Precautions Precautions: Posterior Hip, Fall Required Braces or Orthoses: Knee Immobilizer - Right Knee Immobilizer - Right: Other (comment) (for comfort) Restrictions Weight Bearing Restrictions: Yes RLE Weight Bearing: Touchdown weight bearing LLE Weight Bearing: Weight bearing as tolerated Other Position/Activity Restrictions: LLE WBAT for transfers only x 4 weeks General:      Therapy/Group: Individual Therapy  Orrin Brigham 01/31/2023, 7:45 AM
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
## 2023-01-31 NOTE — Progress Notes (Signed)
PROGRESS NOTE   Subjective/Complaints: No events overnight.  Patient doing well overall, only concern is some difficulty staying asleep overnight due to spasms and pain in her hip. Vitals stable HgB stable, other labs unchanged  ROS: does not like nutritional supplements, pain is well controlled Objective:   No results found. Recent Labs    01/29/23 0700  WBC 6.8  HGB 8.5*  HCT 27.9*  PLT 387    Recent Labs    01/29/23 0700  NA 135  K 3.7  CL 103  CO2 22  GLUCOSE 83  BUN 8  CREATININE 0.67  CALCIUM 8.6*     Intake/Output Summary (Last 24 hours) at 01/31/2023 0818 Last data filed at 01/30/2023 1521 Gross per 24 hour  Intake 456 ml  Output --  Net 456 ml         Physical Exam: Vital Signs Blood pressure 111/69, pulse 93, temperature 98.4 F (36.9 C), temperature source Oral, resp. rate 18, height 5\' 2"  (1.575 m), weight 58.5 kg, last menstrual period 01/08/2023, SpO2 99 %. Gen: no distress, normal appearing.  Sitting up and right in bed, face timing with friend. HEENT: oral mucosa pink and moist, NCAT Cardio: Tachycardia Chest: normal effort, normal rate of breathing Abd: soft, non-distended Ext: no edema Psych: pleasant, normal affect Skin: Multiple bilateral lower extremity abrasions, covered in Mepilex, no apparent drainage. MSK:      No apparent deformity.      Strength: All 4 limbs antigravity and against resistance in bed         Neurologic exam:  Cognition: AAO to person, place, time.  No apparent deficits.   Assessment/Plan: 1. Functional deficits which require 3+ hours per day of interdisciplinary therapy in a comprehensive inpatient rehab setting. Physiatrist is providing close team supervision and 24 hour management of active medical problems listed below. Physiatrist and rehab team continue to assess barriers to discharge/monitor patient progress toward functional and medical  goals  Care Tool:  Bathing    Body parts bathed by patient: Right arm, Left arm, Chest, Abdomen, Front perineal area, Right upper leg, Left upper leg, Face   Body parts bathed by helper: Buttocks, Right lower leg, Left lower leg     Bathing assist Assist Level: Maximal Assistance - Patient 24 - 49%     Upper Body Dressing/Undressing Upper body dressing   What is the patient wearing?: Pull over shirt    Upper body assist Assist Level: Contact Guard/Touching assist    Lower Body Dressing/Undressing Lower body dressing      What is the patient wearing?: Underwear/pull up, Pants     Lower body assist Assist for lower body dressing: Moderate Assistance - Patient 50 - 74%     Toileting Toileting    Toileting assist Assist for toileting: Minimal Assistance - Patient > 75%     Transfers Chair/bed transfer  Transfers assist     Chair/bed transfer assist level: Minimal Assistance - Patient > 75%     Locomotion Ambulation   Ambulation assist   Ambulation activity did not occur: Safety/medical concerns          Walk 10 feet activity   Assist  Walk 10 feet activity did not occur: Safety/medical concerns        Walk 50 feet activity   Assist Walk 50 feet with 2 turns activity did not occur: Safety/medical concerns         Walk 150 feet activity   Assist Walk 150 feet activity did not occur: Safety/medical concerns         Walk 10 feet on uneven surface  activity   Assist Walk 10 feet on uneven surfaces activity did not occur: Safety/medical concerns         Wheelchair     Assist Is the patient using a wheelchair?: Yes Type of Wheelchair: Manual    Wheelchair assist level: Supervision/Verbal cueing Max wheelchair distance: 150    Wheelchair 50 feet with 2 turns activity    Assist        Assist Level: Supervision/Verbal cueing   Wheelchair 150 feet activity     Assist      Assist Level: Supervision/Verbal  cueing   Blood pressure 111/69, pulse 93, temperature 98.4 F (36.9 C), temperature source Oral, resp. rate 18, height 5\' 2"  (1.575 m), weight 58.5 kg, last menstrual period 01/08/2023, SpO2 99 %.  Medical Problem List and Plan: 1. Functional deficits secondary to pelvic fracture s/p pelvic screw fixation and ORIF by Dr. Carola Frost 6/17             -patient may shower with surgical site covered             -ELOS/Goals: 10 to 14 days, modified independent PT/OT             - TDWB on the RLE and WBAT on the LLE   Grounds pass ordered  2.  Antithrombotics: -DVT/anticoagulation:  Pharmaceutical: Lovenox 30 mg BID             -antiplatelet therapy: none             -ortho recs: anticoagulation for 4 weeks   3. Pain Management: continue Tylenol scheduled; Robaxin and oxycodone as needed   4. Mood/Behavior/Sleep: LCSW to evaluate and provide emotional support             -antipsychotic agents: n/a             -continue Remeron 7.5 mg q HS (depression, sedation, appetite stim)             - IVC lifted 6/21; no current restraints needed.  Recommend neuropsych consult with Dr. Kieth Brightly  -Sleeping poorly at night due to spasms, pain in lower extremities; scheduled Robaxin and melatonin nightly.   5. Neuropsych/cognition: This patient is capable of making decisions on her own behalf.   6. Skin/Wound Care: Routine skin care checks             -monitor surgical incisions>>change Meplilex dressings daily prn             -Bacitacin for road rash   7. Fluids/Electrolytes/Nutrition: Routine Is and Os and follow-up chemistries             -continue Vitamin D, B1, C, D3, MVI  -Labs stable   8: Vitamin D deficiency: continue supplementation   9: Anemia; acute/chronic blood loss and history of iron deficiency             -s/p 1 unit PRBCs 6/21             -follow-up CBC - stable 8.5; some mild reactive thrombocytosis, monitor.   10: Right hip  dislocation and acetabular fracture s/p closed reduction,  traction; screw fixation, ORIF Dr. Carola Frost 6/17             -TDWB R leg x 8 weeks then graduated WB thereafter              -WBAT L leg for transfers x 4 weeks then progress             -bed to chair transfers only for the first 4 weeks post op              -posterior hip precautions R hip x 12 weeks              -no return to work for ~2 months   11: Retained vaginal tampon/Chlamydia infection; azithromycin 1 g given             -continue flagyl 500 mg BID for 7 days (end 6/25)   12: Hyponatremia: reviewed and has normalized. Monitor on Mondays.  Stable.   13: Alcohol use: cessation counseling, continue thiamine. Start magnesium gluconate 250mg  HS.   14. Tachycardia: start magnesium gluconate 250mg  HS -resolved  15. Hypocalcemia: continue prenatal multivitamin    LOS: 3 days A FACE TO FACE EVALUATION WAS PERFORMED  Angelina Sheriff 01/31/2023, 8:18 AM

## 2023-01-31 NOTE — Progress Notes (Signed)
Occupational Therapy Session Note  Patient Details  Name: Patricia [name] MRN: [id number] Date of Birth: 11/26/2000  Today's Date: 01/31/2023 OT Individual Time: 1914-7829 OT Individual Time Calculation (min): 72 min    Short Term Goals: Week 1:  OT Short Term Goal 1 (Week 1): STG=LTG's d/t LOS  Skilled Therapeutic Interventions/Progress Updates:    Patient received seated in wheelchair eager to shower.  Patient able to choose clothing, able to navigate her wheelchair somewhat in room.  Patient aware of posterior hip precautions and weight bearing status.  Transfer from wheelchair to tub bench with contact guard and verbal cueing.  Patient reports bench is hard - not comfortable to sit on , slide across.  Patient able to transition to standing to wash backs of thighs, etc.  Cueing needed to ensure TDWB RLE.  Patient able to effectively use reacher to don LB Clothing.   Discussed home and work physical environments.  Patient needs to be able to ascend/ descend stairs at her home.  Has option to park in front at work and then no stairs to climb.  Would need handicapped parking sticker.   Patient left up in wheelchair with call bell and personal items in reach.    Therapy Documentation Precautions:  Precautions Precautions: Posterior Hip, Fall Required Braces or Orthoses: Knee Immobilizer - Right Knee Immobilizer - Right: Other (comment) (for comfort) Restrictions Weight Bearing Restrictions: Yes RLE Weight Bearing: Touchdown weight bearing LLE Weight Bearing: Weight bearing as tolerated Other Position/Activity Restrictions: LLE WBAT for transfers only x 4 weeks   Pain: Denies pain   Therapy/Group: Individual Therapy  Collier Salina 01/31/2023, 12:53 PM

## 2023-01-31 NOTE — Progress Notes (Signed)
Inpatient Rehabilitation Center Individual Statement of Services  Patient Name:  Patricia [name]  Date:  01/31/2023  Welcome to the Inpatient Rehabilitation [facility].  Our goal is to provide you with an individualized program based on your diagnosis and situation, designed to meet your specific needs.  With this comprehensive rehabilitation program, you will be expected to participate in at least 3 hours of rehabilitation therapies Monday-Friday, with modified therapy programming on the weekends.  Your rehabilitation program will include the following services:  Physical Therapy (PT), Occupational Therapy (OT), 24 hour per day rehabilitation nursing, Therapeutic Recreaction (TR), Neuropsychology, Care Coordinator, Rehabilitation Medicine, Nutrition Services, and Pharmacy Services  Weekly team conferences will be held on Wednesday to discuss your progress.  Your Inpatient Rehabilitation Care Coordinator will talk with you frequently to get your input and to update you on team discussions.  Team conferences with you and your family in attendance may also be held.  Expected length of stay: 7-10 days  Overall anticipated outcome: Independent with device  Depending on your progress and recovery, your program may change. Your Inpatient Rehabilitation Care Coordinator will coordinate services and will keep you informed of any changes. Your Inpatient Rehabilitation Care Coordinator's name and contact numbers are listed  below.  The following services may also be recommended but are not provided by the Inpatient Rehabilitation Center:  Driving Evaluations Home Health Rehabiltiation Services Outpatient Rehabilitation Services Vocational Rehabilitation   Arrangements will be made to provide these services after discharge if needed.  Arrangements include referral to agencies that provide these services.  Your insurance has been verified to be:  [employer] [place] Your primary doctor is:  Celine Mans  Pertinent information will be shared with your doctor and your insurance company.  Inpatient Rehabilitation Care Coordinator:  Dossie Der, [place] [phone] or [phone]  Information discussed with and copy given to patient by: Lucy Chris, 01/31/2023, 1:19 PM

## 2023-01-31 NOTE — Progress Notes (Signed)
Inpatient Rehabilitation  Patient information reviewed and entered into eRehab system by Lennyn Gange M. Vanisha Whiten, M.A., CCC/SLP, PPS Coordinator.  Information including medical coding, functional ability and quality indicators will be reviewed and updated through discharge.    

## 2023-02-01 DIAGNOSIS — S72001A Fracture of unspecified part of neck of right femur, initial encounter for closed fracture: Secondary | ICD-10-CM | POA: Diagnosis not present

## 2023-02-01 DIAGNOSIS — S32401A Unspecified fracture of right acetabulum, initial encounter for closed fracture: Secondary | ICD-10-CM | POA: Diagnosis not present

## 2023-02-01 DIAGNOSIS — S3282XA Multiple fractures of pelvis without disruption of pelvic ring, initial encounter for closed fracture: Secondary | ICD-10-CM | POA: Diagnosis not present

## 2023-02-01 DIAGNOSIS — S32401S Unspecified fracture of right acetabulum, sequela: Secondary | ICD-10-CM | POA: Diagnosis not present

## 2023-02-01 NOTE — Progress Notes (Addendum)
Occupational Therapy Session Note  Patient Details  Name: Patricia [name] MRN: [id number] Date of Birth: May 15, 2001  Today's Date: 02/01/2023 OT Individual Time: 4696-2952 OT Individual Time Calculation (min): 71 min    Short Term Goals: Week 1:  OT Short Term Goal 1 (Week 1): STG=LTG's d/t LOS  Skilled Therapeutic Interventions/Progress Updates:    Patient received seated in wheelchair.  Reports just waking up, "not a morning person."  Agreeable to shower.  Has asked her family to measure doorframes to determine wheelchair availability in bathroom - has not heard back.  Need to establish bathroom equipment.  Patient agreeable to shower and able to navigate around room in wheelchair with leg rests off to gather clothing.  Also able to maneuver up incline to enter batroom without assistance.  Problem solved with patient best position of chair for shower transfer - patient needing min cueing and physical assistance to position wheelchair.  Attempted padded tub bench with cutout, but this was not comfortable for patient.  Changed back to standard tub transfer bench.  Anticipate patient could benefit from suction cup grab bar.  Patient able to effectively use reacher to aide with LB dressing this session.   Patient wheeled herself to tub room to practice actual tub/ shower transfer using tub transfer bench.  Patient able to complete transfer with supervision following demonstration.   Left up in wheelchair with personal items in reach.    Therapy Documentation Precautions:  Precautions Precautions: Posterior Hip, Fall Required Braces or Orthoses: Knee Immobilizer - Right Knee Immobilizer - Right: Other (comment) (for comfort) Restrictions Weight Bearing Restrictions: Yes RLE Weight Bearing: Touchdown weight bearing LLE Weight Bearing: Weight bearing as tolerated Other Position/Activity Restrictions: LLE WBAT for transfers only x 4 weeks    Pain:  Reports difficulty finding comfortable  positions to sleep.  Pain R hip 3/10     Therapy/Group: Individual Therapy  Collier Salina 02/01/2023, 9:13 AM

## 2023-02-01 NOTE — Progress Notes (Signed)
PROGRESS NOTE   Subjective/Complaints: Patient would like to d/c wed, discussed caregiver training tomorrow, discussed d/c thurs with team and SW She has no new complaints this morning  ROS: does not like nutritional supplements, pain is well controlled, +constipation Objective:   No results found. Recent Labs    01/31/23 0922  WBC 7.5  HGB 9.9*  HCT 32.8*  PLT 586*   Recent Labs    01/31/23 0922  NA 136  K 3.8  CL 104  CO2 22  GLUCOSE 88  BUN 7  CREATININE 0.67  CALCIUM 9.4    Intake/Output Summary (Last 24 hours) at 02/01/2023 1026 Last data filed at 02/01/2023 0731 Gross per 24 hour  Intake 480 ml  Output --  Net 480 ml        Physical Exam: Vital Signs Blood pressure (!) 83/69, pulse 96, temperature 97.7 F (36.5 C), temperature source Oral, resp. rate 18, height 5\' 2"  (1.575 m), weight 58.5 kg, last menstrual period 01/08/2023, SpO2 94 %. Gen: no distress, normal appearing.  Sitting up and right in bed, face timing with friend. HEENT: oral mucosa pink and moist, NCAT Cardio: Tachycardia, hypotensive Chest: normal effort, normal rate of breathing Abd: soft, non-distended Ext: no edema Psych: pleasant, normal affect Skin: Multiple bilateral lower extremity abrasions, covered in Mepilex, no apparent drainage. MSK:      No apparent deformity.      Strength: All 4 limbs antigravity and against resistance in bed         Neurologic exam:  Cognition: AAO to person, place, time.  No apparent deficits.   Assessment/Plan: 1. Functional deficits which require 3+ hours per day of interdisciplinary therapy in a comprehensive inpatient rehab setting. Physiatrist is providing close team supervision and 24 hour management of active medical problems listed below. Physiatrist and rehab team continue to assess barriers to discharge/monitor patient progress toward functional and medical goals  Care  Tool:  Bathing    Body parts bathed by patient: Right arm, Left arm, Chest, Abdomen, Front perineal area, Right upper leg, Left upper leg, Face   Body parts bathed by helper: Buttocks, Right lower leg, Left lower leg     Bathing assist Assist Level: Maximal Assistance - Patient 24 - 49%     Upper Body Dressing/Undressing Upper body dressing   What is the patient wearing?: Pull over shirt    Upper body assist Assist Level: Contact Guard/Touching assist    Lower Body Dressing/Undressing Lower body dressing      What is the patient wearing?: Underwear/pull up, Pants     Lower body assist Assist for lower body dressing: Moderate Assistance - Patient 50 - 74%     Toileting Toileting    Toileting assist Assist for toileting: Minimal Assistance - Patient > 75%     Transfers Chair/bed transfer  Transfers assist     Chair/bed transfer assist level: Minimal Assistance - Patient > 75%     Locomotion Ambulation   Ambulation assist   Ambulation activity did not occur: Safety/medical concerns          Walk 10 feet activity   Assist  Walk 10 feet activity did not  occur: Safety/medical concerns        Walk 50 feet activity   Assist Walk 50 feet with 2 turns activity did not occur: Safety/medical concerns         Walk 150 feet activity   Assist Walk 150 feet activity did not occur: Safety/medical concerns         Walk 10 feet on uneven surface  activity   Assist Walk 10 feet on uneven surfaces activity did not occur: Safety/medical concerns         Wheelchair     Assist Is the patient using a wheelchair?: Yes Type of Wheelchair: Manual    Wheelchair assist level: Supervision/Verbal cueing Max wheelchair distance: 150    Wheelchair 50 feet with 2 turns activity    Assist        Assist Level: Supervision/Verbal cueing   Wheelchair 150 feet activity     Assist      Assist Level: Supervision/Verbal cueing    Blood pressure (!) 83/69, pulse 96, temperature 97.7 F (36.5 C), temperature source Oral, resp. rate 18, height 5\' 2"  (1.575 m), weight 58.5 kg, last menstrual period 01/08/2023, SpO2 94 %.  Medical Problem List and Plan: 1. Functional deficits secondary to pelvic fracture s/p pelvic screw fixation and ORIF by Dr. Carola Frost 6/17             -patient may shower with surgical site covered             -ELOS/Goals: 6 days, modified independent PT/OT             - TDWB on the RLE and WBAT on the LLE   Grounds pass ordered  Discussed patient's desire to d/c on Wednesday, discussed that we can do caregiver education Wednesday and have patient d/c on Thurs 2.  Antithrombotics: -DVT/anticoagulation:  Pharmaceutical: Lovenox 30 mg BID             -antiplatelet therapy: none             -ortho recs: anticoagulation for 4 weeks, discussed with nursing   3. Pain Management: continue Tylenol scheduled; Robaxin and oxycodone as needed   4. Mood/Behavior/Sleep: LCSW to evaluate and provide emotional support             -antipsychotic agents: n/a             -continue Remeron 7.5 mg q HS (depression, sedation, appetite stim)             - IVC lifted 6/21; no current restraints needed.  Recommend neuropsych consult with Dr. Kieth Brightly  -Sleeping poorly at night due to spasms, pain in lower extremities; scheduled Robaxin and melatonin nightly.   5. Neuropsych/cognition: This patient is capable of making decisions on her own behalf.   6. Skin/Wound Care: Routine skin care checks             -monitor surgical incisions>>change Meplilex dressings daily prn             -Bacitacin for road rash   7. Fluids/Electrolytes/Nutrition: Routine Is and Os and follow-up chemistries             -continue Vitamin D, B1, C, D3, MVI  -Labs stable   8: Vitamin D deficiency: continue supplementation   9: Anemia; acute/chronic blood loss and history of iron deficiency             -s/p 1 unit PRBCs 6/21              -  follow-up CBC - stable 8.5; some mild reactive thrombocytosis, monitor.   10: Right hip dislocation and acetabular fracture s/p closed reduction, traction; screw fixation, ORIF Dr. Carola Frost 6/17             -TDWB R leg x 8 weeks then graduated WB thereafter              -WBAT L leg for transfers x 4 weeks then progress             -bed to chair transfers only for the first 4 weeks post op              -posterior hip precautions R hip x 12 weeks              -no return to work for ~2 months   11: Retained vaginal tampon/Chlamydia infection; azithromycin 1 g given             -continue flagyl 500 mg BID for 7 days (end 6/25)   12: Hyponatremia: reviewed and has normalized. Monitor on Mondays.  Stable.   13: Alcohol use: cessation counseling, continue thiamine. Start magnesium gluconate 250mg  HS.   14. Tachycardia: start magnesium gluconate 250mg  HS -resolved  15. Hypocalcemia: continue prenatal multivitamin  16. Hypotension: d/c magnesium supplement  >50 minutes spent in discussion of patient's desire to d/c on Wednesday, discussed with team and we can do caregiver education tomorrow and have patient d/c thurs, discussed that patient will need 4 weeks anticoagulation, reviewed vitals, d/ced magnesium supplement given hypotension    LOS: 4 days A FACE TO FACE EVALUATION WAS PERFORMED  Clint Bolder P Demico Ploch 02/01/2023, 10:26 AM

## 2023-02-01 NOTE — Progress Notes (Signed)
Physical Therapy Session Note  Patient Details  Name: Patricia [name] MRN: [id number] Date of Birth: 2000-10-13  Today's Date: 02/01/2023 PT Individual Time: 1130-1200 PT Individual Time Calculation (min): 30 min   Short Term Goals: Week 1:  PT Short Term Goal 1 (Week 1): STG = LTGS 2/2 elos  Skilled Therapeutic Interventions/Progress Updates: Pt presents sitting in w/c and agreeable to therapy.  Pt negotiated w/c from room to small gym w/ supervision to Mod I.  Pt able to manage leg rests w/ set-up, taking from pt and handing back to pt and placed on pegs.  Pt performed UBE at level 1 x 5' forwards and 5' backwards sitting forward on seat for trunk strengthening.  Pt pushes w/c back from UBE using LES.  Pt negotiated w/c up ramp w/ CGA/min A for initial slope but then Close supervision only.  Pt returned to room w/ sup/mod I including positioning self alongside bed .  All needs in reach.     Therapy Documentation Precautions:  Precautions Precautions: Posterior Hip, Fall Required Braces or Orthoses: Knee Immobilizer - Right Knee Immobilizer - Right: Other (comment) (for comfort) Restrictions Weight Bearing Restrictions: Yes RLE Weight Bearing: Touchdown weight bearing LLE Weight Bearing: Weight bearing as tolerated Other Position/Activity Restrictions: LLE WBAT for transfers only x 4 weeks General:   Vital Signs:  Pain:0/10 Pain Assessment Pain Scale: 0-10 Pain Score: 0-No pain    Therapy/Group: Individual Therapy  Lucio Edward 02/01/2023, 12:14 PM

## 2023-02-01 NOTE — Progress Notes (Signed)
Educated patient on Lovonox injection administration. Will pass on to on coming nurse to continue education for discharge.

## 2023-02-01 NOTE — Progress Notes (Signed)
Physical Therapy Session Note  Patient Details  Name: Patricia [name] MRN: [id number] Date of Birth: [birth year]  Today's Date: 02/01/2023 PT Individual Time: 1330-1430 PT Individual Time Calculation (min): 60 min   Short Term Goals: Week 1:  PT Short Term Goal 1 (Week 1): STG = LTGS 2/2 elos  Skilled Therapeutic Interventions/Progress Updates:      Pt sitting in w/c to start - in her personal wheelchair that was delivered to room - fits well without complaints of discomfort. No c/o pain during treatment. Pt propelled herself mod I at wheelchair level from her room to main rehab gym, ~158ft. Spent time problem solving her stairs to enter home. Pt unable to have insurance get shower chair as she's getting Tub Transfer bench - discussed options for self pay, thrift stores, church communities, etc. Also discussed other options such as scooting herself on her bottom up the steps vs being bumped up/down the stairs at wheelchair level. Patient practiced scooting herself up/down the steps without assist but she did need minA for controlled sitting onto the steps and for standing up from the ground while complying with her WB restrictions. Pt choosing to be bumped up/down at wheelchair level and she was assisted with this using +2 assist, dependently at wheelchair level, for 4 steps. Will train her brother tomorrow when he comes for family ed/training. The rest of the session was spent completing wheelchair level there-ex for RLE strengthening, all isometrics for hamstring curls, knee extension, hip abd/add. Also discussed pressure relief methods (arm chair push ups and lateral leans) for pressure relief on her bottom, which she demonstrated her ability to do so. Pt returned to her room and ended session in wheelchair with all needs met.     Therapy Documentation Precautions:  Precautions Precautions: Posterior Hip, Fall Required Braces or Orthoses: Knee Immobilizer - Right Knee Immobilizer - Right:  Other (comment) (for comfort) Restrictions Weight Bearing Restrictions: Yes RLE Weight Bearing: Touchdown weight bearing LLE Weight Bearing: Weight bearing as tolerated Other Position/Activity Restrictions: LLE WBAT for transfers only x 4 weeks General:     Therapy/Group: Individual Therapy  Orrin Brigham 02/01/2023, 7:49 AM

## 2023-02-01 NOTE — Progress Notes (Signed)
Occupational Therapy Session Note  Patient Details  Name: Patricia [name] MRN: [id number] Date of Birth: Dec 19, 2000  Today's Date: 02/01/2023 OT Individual Time: 1034-1130 OT Individual Time Calculation (min): 56 min    Short Term Goals: Week 1:  OT Short Term Goal 1 (Week 1): STG=LTG's d/t LOS  Skilled Therapeutic Interventions/Progress Updates:  Pt received sitting in wheelchair talking on the phone.  No c/o pain at this time. Pt able to verbalize hip precautions and WB restrictions.  Pt able to squat<>pivot from hospital wheelchair to personal wheelchair with Mod I.  Wheelchair propulsion from room to gym 132ft with (S).  Stand<>pivot transfer from wheelchair to mat with Mod I.  Pt able to complete trunk and weight shifting activity with beach ball 2x10 to increase weightshifting, and core strength for increased indpendence with ADL tasks.  Activity graded up using 3.3 lb weighted ball and posterior lean 2x10.  Pt expressed that she was having difficulty sleeping at night related to positioning.  Education provided to pt related to sleep positioning.  Pt stated that she would be sleeping on the couch for a while when she is first d/cd home.  Pt able to go from supine<>side lying with min facilitation to maintain hip precautions due to having no pillow.  Wheelchair propulsion 149ft back to room with (S).  Pt left sitting in wheelchair call bell within reach and all needs met.      Therapy Documentation Precautions:  Precautions Precautions: Posterior Hip, Fall Required Braces or Orthoses: Knee Immobilizer - Right Knee Immobilizer - Right: Other (comment) (for comfort) Restrictions Weight Bearing Restrictions: Yes RLE Weight Bearing: Touchdown weight bearing LLE Weight Bearing: Weight bearing as tolerated Other Position/Activity Restrictions: LLE WBAT for transfers only x 4 weeks    Therapy/Group: Individual Therapy  Liam Graham 02/01/2023, 8:29 AM

## 2023-02-01 NOTE — Progress Notes (Addendum)
Patient ID: Patricia [name], female   DOB: 06-29-2001, 22 y.o.   MRN: [id number] MD reports pt wants to discharge soon and can have brother come in 8:00-9:00 or 9;30 for education and then discharge Thursday. Will order DME and aware follow up will be once can WB  3:05 PM Pt aware insurance will cover wheelchair or rolling walker so will need to get on her own. Also covers one tb equipment not two.

## 2023-02-01 NOTE — Discharge Instructions (Addendum)
Inpatient Rehab Discharge Instructions  Patricia [name] Discharge date and time: 02/03/2023   Activities/Precautions/ Functional Status: Activity: no lifting, driving, or strenuous exercise until cleared by MD. Touch down weight-bearing on right leg and weight-bear as tolerated on left leg. Adhere to "posterior hip precaution" recommendations.  Diet: regular diet Wound Care: keep wound clean and dry; sutures will remain for about another week  Functional status:  ___ No restrictions     ___ Walk up steps independently ___ 24/7 supervision/assistance   ___ Walk up steps with assistance _x__ Intermittent supervision/assistance  ___ Bathe/dress independently ___ Walk with walker     ___ Bathe/dress with assistance ___ Walk Independently    ___ Shower independently ___ Walk with assistance    __x_ Shower with assistance _x__ No alcohol     ___ Return to work/school ________  Special Instructions: No driving, alcohol consumption or tobacco use.   COMMUNITY REFERRALS UPON DISCHARGE:    HOME EXERCISE PROGRAM UNTIL CAN WEIGHT BEAR THEN WILL NEED OUTPATIENT REHAB  Medical Equipment/Items Ordered:WHEELCHAIR, TUB BENCH AND 3 IN 1 WILL NEED TO GET ROLLING WALKER ON OWN SINCE NOT COVERED                                                 Agency/Supplier:[facility]   [phone]  GENERAL COMMUNITY RESOURCES FOR PATIENT/FAMILY: Mental Health: [facility] [address] [place] [address] PHONE: [phone]     My questions have been answered and I understand these instructions. I will adhere to these goals and the provided educational materials after my discharge from the hospital.  Patient/Caregiver Signature _______________________________ Date __________  Clinician Signature _______________________________________ Date __________  Please bring this form and your medication list with you to all your follow-up doctor's appointments.     Information on my medicine -  ELIQUIS (apixaban)  This medication education was reviewed with me or my healthcare representative as part of my discharge preparation.  Why was Eliquis prescribed for you? Eliquis was prescribed for you to reduce the risk of blood clots forming after orthopedic surgery.    What do You need to know about Eliquis? Take your Eliquis TWICE DAILY - one tablet in the morning and one tablet in the evening with or without food.  It would be best to take the dose about the same time each day.  If you have difficulty swallowing the tablet whole please discuss with your pharmacist how to take the medication safely.  Take Eliquis exactly as prescribed by your doctor and DO NOT stop taking Eliquis without talking to the doctor who prescribed the medication.  Stopping without other medication to take the place of Eliquis may increase your risk of developing a clot.  After discharge, you should have regular check-up appointments with your healthcare provider that is prescribing your Eliquis.  What do you do if you miss a dose? If a dose of ELIQUIS is not taken at the scheduled time, take it as soon as possible on the same day and twice-daily administration should be resumed.  The dose should not be doubled to make up for a missed dose.  Do not take more than one tablet of ELIQUIS at the same time.  Important Safety Information A possible side effect of Eliquis is bleeding. You should call your healthcare provider right away if you experience any of  the following: Bleeding from an injury or your nose that does not stop. Unusual colored urine (red or dark brown) or unusual colored stools (red or black). Unusual bruising for unknown reasons. A serious fall or if you hit your head (even if there is no bleeding).  Some medicines may interact with Eliquis and might increase your risk of bleeding or clotting while on Eliquis. To help avoid this, consult your healthcare provider or pharmacist prior  to using any new prescription or non-prescription medications, including herbals, vitamins, non-steroidal anti-inflammatory drugs (NSAIDs) and supplements.  This website has more information on Eliquis (apixaban): http://[email or web address].com/eliquis/home

## 2023-02-02 ENCOUNTER — Other Ambulatory Visit (HOSPITAL_COMMUNITY): Payer: Self-pay

## 2023-02-02 ENCOUNTER — Inpatient Hospital Stay (HOSPITAL_COMMUNITY): Payer: Medicaid Other

## 2023-02-02 DIAGNOSIS — F3341 Major depressive disorder, recurrent, in partial remission: Secondary | ICD-10-CM | POA: Diagnosis not present

## 2023-02-02 DIAGNOSIS — S72001S Fracture of unspecified part of neck of right femur, sequela: Secondary | ICD-10-CM | POA: Diagnosis not present

## 2023-02-02 DIAGNOSIS — S32592D Other specified fracture of left pubis, subsequent encounter for fracture with routine healing: Secondary | ICD-10-CM | POA: Diagnosis not present

## 2023-02-02 DIAGNOSIS — S329XXD Fracture of unspecified parts of lumbosacral spine and pelvis, subsequent encounter for fracture with routine healing: Secondary | ICD-10-CM | POA: Diagnosis not present

## 2023-02-02 DIAGNOSIS — S32512D Fracture of superior rim of left pubis, subsequent encounter for fracture with routine healing: Secondary | ICD-10-CM | POA: Diagnosis not present

## 2023-02-02 MED ORDER — MIRTAZAPINE 7.5 MG PO TABS
7.5000 mg | ORAL_TABLET | Freq: Every day | ORAL | 0 refills | Status: DC
  Filled 2023-02-02: qty 30, 30d supply, fill #0

## 2023-02-02 MED ORDER — ASCORBIC ACID 500 MG PO TABS
500.0000 mg | ORAL_TABLET | Freq: Two times a day (BID) | ORAL | 0 refills | Status: DC
  Filled 2023-02-02: qty 100, 50d supply, fill #0

## 2023-02-02 MED ORDER — MAGNESIUM GLUCONATE 500 MG PO TABS
250.0000 mg | ORAL_TABLET | Freq: Every day | ORAL | Status: DC
  Administered 2023-02-02: 250 mg via ORAL
  Filled 2023-02-02: qty 1

## 2023-02-02 MED ORDER — PRENATAL MULTIVITAMIN CH
1.0000 | ORAL_TABLET | Freq: Every day | ORAL | 0 refills | Status: DC
  Filled 2023-02-02 (×2): qty 30, 30d supply, fill #0

## 2023-02-02 MED ORDER — ACETAMINOPHEN 500 MG PO TABS: 1000.0000 mg | ORAL_TABLET | Freq: Four times a day (QID) | ORAL | 0 refills | Status: DC

## 2023-02-02 MED ORDER — BACITRACIN ZINC 500 UNIT/GM EX OINT: TOPICAL_OINTMENT | CUTANEOUS | 0 refills | Status: DC | PRN

## 2023-02-02 MED ORDER — MAGNESIUM GLUCONATE 500 MG PO TABS: 250.0000 mg | ORAL_TABLET | Freq: Every day | ORAL | Status: DC

## 2023-02-02 MED ORDER — OXYCODONE HCL 5 MG PO TABS
5.0000 mg | ORAL_TABLET | ORAL | 0 refills | Status: DC | PRN
  Filled 2023-02-02: qty 15, 2d supply, fill #0

## 2023-02-02 MED ORDER — THIAMINE HCL 100 MG PO TABS
100.0000 mg | ORAL_TABLET | Freq: Every day | ORAL | 0 refills | Status: DC
  Filled 2023-02-02: qty 30, 30d supply, fill #0

## 2023-02-02 MED ORDER — MELATONIN 5 MG PO TABS
5.0000 mg | ORAL_TABLET | Freq: Every day | ORAL | 0 refills | Status: DC
  Filled 2023-02-02: qty 30, 30d supply, fill #0

## 2023-02-02 MED ORDER — METHOCARBAMOL 750 MG PO TABS
750.0000 mg | ORAL_TABLET | Freq: Every day | ORAL | 0 refills | Status: DC
  Filled 2023-02-02: qty 30, 30d supply, fill #0

## 2023-02-02 MED ORDER — VITAMIN D (ERGOCALCIFEROL) 1.25 MG (50000 UNIT) PO CAPS
50000.0000 [IU] | ORAL_CAPSULE | ORAL | 0 refills | Status: DC
  Filled 2023-02-02: qty 5, 35d supply, fill #0

## 2023-02-02 MED ORDER — VITAMIN D3 25 MCG PO TABS
2000.0000 [IU] | ORAL_TABLET | Freq: Two times a day (BID) | ORAL | 0 refills | Status: DC
  Filled 2023-02-02: qty 60, 15d supply, fill #0

## 2023-02-02 MED ORDER — APIXABAN 2.5 MG PO TABS
2.5000 mg | ORAL_TABLET | Freq: Two times a day (BID) | ORAL | 0 refills | Status: DC
  Filled 2023-02-02: qty 60, 30d supply, fill #0

## 2023-02-02 NOTE — Progress Notes (Signed)
Inpatient Rehabilitation Care Coordinator Discharge Note   Patient Details  Name: Patricia [name] MRN: [id number] Date of Birth: March 13, 2001   Discharge location: HOME WITH MOM AND BROTHERS  Length of Stay: 6 DAYS  Discharge activity level: MOD/I WHEELCHAIR LEVEL DUE TO WB PRECAUTIONS  Home/community participation: ACTIVE  Patient response HY:QMVHQI Literacy - How often do you need to have someone help you when you read instructions, pamphlets, or other written material from your doctor or pharmacy?: Never  Patient response ON:GEXBMW Isolation - How often do you feel lonely or isolated from those around you?: Never  Services provided included: MD, RD, PT, OT, RN, CM, Pharmacy, Neuropsych, SW  Financial Services:  Field seismologist Utilized: Medicaid    Choices offered to/list presented to: PT  Follow-up services arranged:  DME, Patient/Family has no preference for HH/DME agencies      DME : [facility]  WHEELCHAIR, TUB BENCH AND 3 IN 1 WILL NEED TO GET ROLLING WALKER AND TUB SEAT ON OWN DUE TO NOT COVERED WILL NEED OUTPATIENT REHAB ONCE CAN WEIGHT BEAR. HAS HOME EXERCISE PROGRAM FOR HOME. HAS RESOURCES FOR [facility] TO FOLLOW UP WITH    Patient response to transportation need: Is the patient able to respond to transportation needs?: Yes In the past 12 months, has lack of transportation kept you from medical appointments or from getting medications?: No In the past 12 months, has lack of transportation kept you from meetings, work, or from getting things needed for daily living?: No   Patient/Family verbalized understanding of follow-up arrangements:  Yes  Individual responsible for coordination of the follow-up plan: SELF [phone]  MARY-MOM [phone]  Confirmed correct DME delivered: Lucy Chris 02/02/2023    Comments (or additional information): BROTHER CAME IN FOR FAMILY EDUCATION AND WAS COMPLETED-ASSIST WITH STEPS INTO HOME.    Summary of Stay    Date/Time Discharge Planning CSW  02/01/23 [phone] Home with brothers and Mom taking FMLA-PA completing pt's FMLA. Family education tomorrow and DC Thursday. ordered DME and follow up once can WB RGD       Andria Head, Lemar Livings

## 2023-02-02 NOTE — Patient Care Conference (Signed)
Inpatient RehabilitationTeam Conference and Plan of Care Update Date: 02/02/2023   Time: 11:09 AM    Patient Name: Patricia [name]      Medical Record Number: [id number]  Date of Birth: Dec 31, 2000 Sex: Female         Room/Bed: 4W13C/4W13C-01 Payor Info: Payor: [place] MEDICAID PREPAID HEALTH PLAN / Plan: [place] MEDICAID [employer] [place] / Product Type: *No Product type* /    Admit Date/Time:  01/28/2023  5:15 PM  Primary Diagnosis:  Closed right hip fracture [facility])  Hospital Problems: Principal Problem:   Closed right hip fracture (HCC) Active Problems:   Closed fracture of right acetabulum [facility])    Expected Discharge Date: Expected Discharge Date: 02/03/23  Team Members Present: Physician leading conference: Dr. Sula Soda Social Worker Present: Dossie Der, LCSW Nurse Present: Chana Bode, RN PT Present: Wynelle Link, PT OT Present: Bretta Bang, OT PPS Coordinator present : Fae Pippin, SLP     Current Status/Progress Goal Weekly Team Focus  Bowel/Bladder   Pt is continent of bowel/bladder   Pt will remain continent of bowel/bladder   Will assess qshift and PRN    Swallow/Nutrition/ Hydration               ADL's   Min assist/ verbal cueing   Mod I/ Supervision   Functional transfers, family education, discharge preparation    Mobility   Goal level, setupA for wheelchair management. Dependent assist for stairs with wheelchair due to WB restrictions. Older brother arrived with 3 minutes left in therapy session and was quickly educated and trained on stair navigation.   mod I  DC planning. HEP provided for hip precautions and exercises.    Communication                Safety/Cognition/ Behavioral Observations               Pain   Pt currently denies pain   Pt will continue to deny pain   Will assess qshift and PRN    Skin   Pt has abrasions to bilateral legs, R knee and back.  Has incisions to the right hip and lower abdomen.   Pt's  abrasions and incisions will heal  Will assess qshift and PRN      Discharge Planning:  Home with brothers and Mom taking FMLA-PA completing pt's FMLA. Family education tomorrow and DC Thursday. ordered DME and follow up once can WB   Team Discussion: Patient is doing well overall; mod I for bathing and dressing, set up for wheelchair transfers. Limited by anxiety.  Patient on target to meet rehab goals: yes  *See Care Plan and progress notes for long and short-term goals.   Revisions to Treatment Plan:  Ortho confirmed sutures remain for 7-10 more days Behavior Health follow up Pet therapy   Teaching Needs: Safety, weight bearing restrictions,   Current Barriers to Discharge: Home enviroment access/layout, Lack of/limited family support, and Weight bearing restrictions  Possible Resolutions to Barriers: Family education Home exercise program; OP follow up when weight bearing restrictions lifted DME: W/C and TTB     Medical Summary Current Status: pelvic and acetabular fractures, anxiety  Barriers to Discharge: Medical stability  Barriers to Discharge Comments: pelvic and acetabular fractures, anxiety Possible Resolutions to Barriers/Weekly Focus: consulted orthopedics, XRs ordered for today, magnesium supplement started at night, dsicussed when sutures will be removed   Continued Need for Acute Rehabilitation Level of Care: The patient requires daily medical management by a  physician with specialized training in physical medicine and rehabilitation for the following reasons: Direction of a multidisciplinary physical rehabilitation program to maximize functional independence : Yes Medical management of patient stability for increased activity during participation in an intensive rehabilitation regime.: Yes Analysis of laboratory values and/or radiology reports with any subsequent need for medication adjustment and/or medical intervention. : Yes   I attest that I was  present, lead the team conference, and concur with the assessment and plan of the team.   Chana Bode B 02/02/2023, 2:27 PM

## 2023-02-02 NOTE — Progress Notes (Addendum)
Patient ID: Patricia [name], female   DOB: Jul 20, 2001, 22 y.o.   MRN: [id number]  Met with pt to discuss team conference goals being met and discharge tomorrow. She feels ready upset with family and loss of jewelry in ER. Discussed MH counseling and resources. She will think about. Can provide her with resources to follow up with if needed. She feels ready to go home tomorrow.  3:36 PM Gave pt her's and her Mom's FMLA paperwork. She will get to her Mom. Feels ready for discharge.

## 2023-02-02 NOTE — Progress Notes (Signed)
Occupational Therapy Discharge Summary  Patient Details  Name: Patricia [name] MRN: [id number] Date of Birth: 09-27-2000  Date of Discharge from OT service:February 02, 2023  Today's Date: 02/02/2023 OT Individual Time: 8413-2440 and 1302-1400 OT Individual Time Calculation (min): 40 min 8 min   Skilled Therapeutic Intervention:   AM Session:  Patient received direct hand off from PT.  Demonstrated to patient and brother tub / shower transfer with tub transfer bench.  Also demonstrated folding wheelchair for car transport.  Patient expressing desire to go home.  "I have been here 2 weeks."  Expressing plan to go to beauty store when home to buy hair dye.  Reviewed importance of adhering to weight bearing restrictions to allow hip/pelvis to properly heal.   Reinforced with brother that 2 people advised to bump patient up stairs as practiced with PT earlier.   Patient allowed to transition to standing with walker to offload right buttock.  Placed pillow on cushion to decrease pressure.  Left up in wheelchair for next therapy session.    PM Session:  Patient received seated in wheelchair in bathroom waiting for assistance to transfer to toilet.  Patient witnessed making safe stand pivot transfer adhering to WB precautions.  After conferring with patient's PT agreed to make patient modified I to transfer herself in the room.  Reviewed what this means for patient.  Patient set herself up for shower including covering her incision/ dressings.  Patient has met all OT goals and is ready for discharge to home.    Patient has met 10 of 10 long term goals due to improved activity tolerance, improved balance, and ability to compensate for deficits.  Patient to discharge at overall Modified Independent level.  Patient's care partner is independent to provide the necessary physical assistance at discharge.    Reasons goals not met: NA  Recommendation:  Patient will benefit from ongoing skilled OT services in home  health setting to continue to advance functional skills in the area of BADL and Vocation.  Equipment: Tub transfer bench, wheelchair  Reasons for discharge: treatment goals met and discharge from hospital  Patient/family agrees with progress made and goals achieved: Yes  OT Discharge Precautions/Restrictions  Precautions Precautions: Posterior Hip;Fall Precaution Booklet Issued: Yes (comment) Required Braces or Orthoses: Knee Immobilizer - Right Restrictions Weight Bearing Restrictions: Yes RLE Weight Bearing: Touchdown weight bearing LLE Weight Bearing: Weight bearing as tolerated Other Position/Activity Restrictions: LLE WBAT for transfers only x 4 weeks     Pain AM Session:   Pain Assessment Pain Score: 4  Pain Type: Acute pain Pain Location: Hip Pain Orientation: Right Pain Descriptors / Indicators: Discomfort;Pressure Pain Onset: On-going Patients Stated Pain Goal: 2 Pain Intervention(s): Environmental changes;Repositioned;Shower Multiple Pain Sites: No PM Session:   Discomfort r hip ADL ADL Equipment Provided: Reacher, Sock aid Eating: Independent Where Assessed-Eating: Chair Grooming: Independent Where Assessed-Grooming: Chair Upper Body Bathing: Modified independent Where Assessed-Upper Body Bathing: Shower Lower Body Bathing: Modified independent Where Assessed-Lower Body Bathing: Shower Upper Body Dressing: Independent Where Assessed-Upper Body Dressing: Chair Lower Body Dressing: Modified independent Where Assessed-Lower Body Dressing: Chair Toileting: Modified independent Where Assessed-Toileting: Bedside Commode, Actuary Transfer: Modified independent Statistician Method: Stand pivot, Squat pivot Acupuncturist: Gaffer: Modified independent Web designer Method: Conservation officer, historic buildings: Insurance underwriter: Modified independent Film/video editor Method:  Administrator: Sales promotion account executive Baseline Vision/History: 0 No visual deficits Patient Visual Report: No change  from baseline Vision Assessment?: No apparent visual deficits Perception  Perception: Within Functional Limits Praxis Praxis: Intact Cognition Cognition Overall Cognitive Status: Within Functional Limits for tasks assessed Arousal/Alertness: Awake/alert Orientation Level: Person;Place;Situation Person: Oriented Place: Oriented Situation: Oriented Memory: Appears intact Attention: Alternating Awareness: Appears intact Problem Solving: Appears intact Behaviors: Other (comment) (anxious at times) Safety/Judgment: Appears intact Brief Interview for Mental Status (BIMS) Repetition of Three Words (First Attempt): 3 Temporal Orientation: Year: Correct Temporal Orientation: Month: Accurate within 5 days Temporal Orientation: Day: Correct Recall: "Sock": Yes, no cue required Recall: "Blue": Yes, no cue required Recall: "Bed": Yes, no cue required BIMS Summary Score: 15 Sensation Sensation Light Touch: Appears Intact Hot/Cold: Appears Intact Proprioception: Appears Intact Stereognosis: Appears Intact Coordination Gross Motor Movements are Fluid and Coordinated: Yes Fine Motor Movements are Fluid and Coordinated: Yes Coordination and Movement Description: Limited by WB restrictions and pelvic discomfort with mobility Finger Nose Finger Test: WNL 9 Hole Peg Test: n/a Motor  Motor Motor: Within Functional Limits Mobility  Bed Mobility Bed Mobility: Supine to Sit;Sit to Supine Supine to Sit: Independent Sit to Supine: Independent Transfers Sit to Stand: Independent with assistive device Stand to Sit: Independent with assistive device  Trunk/Postural Assessment  Cervical Assessment Cervical Assessment: Within Functional Limits Thoracic Assessment Thoracic Assessment: Within Functional Limits Lumbar Assessment Lumbar Assessment:  Exceptions to [facility] (posterior hip precautions) Postural Control Postural Control: Within Functional Limits  Balance Balance Balance Assessed: Yes Dynamic Sitting Balance Dynamic Sitting - Balance Support: No upper extremity supported Dynamic Sitting - Level of Assistance: 6: Modified independent (Device/Increase time) Static Standing Balance Static Standing - Balance Support: During functional activity;Right upper extremity supported;Left upper extremity supported Static Standing - Level of Assistance: 6: Modified independent (Device/Increase time) Extremity/Trunk Assessment RUE Assessment RUE Assessment: Within Functional Limits LUE Assessment LUE Assessment: Within Functional Limits   Collier Salina 02/02/2023, 2:01 PM

## 2023-02-02 NOTE — Progress Notes (Signed)
Physical Therapy Session Note  Patient Details  Name: Patricia [name] MRN: [id number] Date of Birth: Sep 02, 2000  Today's Date: 02/02/2023 PT Individual Time: 0800-0850 + 1000-1055 PT Individual Time Calculation (min): 50 min + 55 min  Short Term Goals: Week 1:  PT Short Term Goal 1 (Week 1): STG = LTGS 2/2 elos  Skilled Therapeutic Interventions/Progress Updates:      1st session: Pt sitting in w/c as NT just assisted with transfer. Pt denies any pain. Patient calling her family via FaceTime on her phone to check on status as they were supposed to be present for family education and training. No family in the room and unsure if family will come today for hands on training as they work at 10:00. Patient asking if her 21 y/o brother can come for family training - asking for older, 21 y.o son to be present for safety concerns.   Pt propelled herself mod I at wheelchair level to main rehab gym, ~120ft. Completed lateral scoot transfer from w/c to mat table with setupA. Completed seated push-ups using blocks to push from - able to achieve full clearance and hold for ~30 seconds to offload buttock.   Provided HEP and educational handouts as listed below. Educated on hip precautions, pt able to recall 2/3.   Access Code: [email or web address] URL: [email or web address] Date: 02/02/2023 Prepared by: Wynelle Link  Exercises - Supine Ankle Pumps  - 1 x daily - 7 x weekly - 3 sets - 15 reps - Supine Quad Set  - 1 x daily - 7 x weekly - 3 sets - 15 reps - Supine Isometric Hip Adduction with Pillow at Knees  - 1 x daily - 7 x weekly - 3 sets - 15 reps - Supine Gluteal Sets  - 1 x daily - 7 x weekly - 3 sets - 15 reps - Supine Heel Slides  - 1 x daily - 7 x weekly - 3 sets - 15 reps - Supine Hip Abduction  - 1 x daily - 7 x weekly - 3 sets - 15 reps - Supine Active Straight Leg Raise  - 1 x daily - 7 x weekly - 3 sets - 15 reps - Supine Short Arc Quad  - 1 x daily - 7 x weekly - 3 sets - 15  reps - Seated Long Arc Quad  - 1 x daily - 7 x weekly - 3 sets - 15 reps - Seated March  - 1 x daily - 7 x weekly - 3 sets - 15 reps  Patient Education - Provided handout on posterior hip precautions   2nd session: Pt sitting in w/c, using her reacher to help organize her room. She reports she's looking for her necklace/chain that she's misplaced since admission, hasn't seen it since she was admitted to ED. RN is aware and let her care team know via secure chat. Pt highly anxious, figity, and frustrated about her condition. Transported patient outside for fresh air to help calm and redirect. While outside, worked on therapeutic calming, active listening, and jovial discussion to boost mood. Discussed returning to work, lifestyle changes, and ways to surround herself with better support system. Patient appreciative and receptive to all education and discussion.  Returned upstairs to [employer] floor. Patient asking if she can weigh herself as she feels she's lost weight. Used standing scale with CGA for sit<>stand and cues for keeping WB restrictions. Standing scale recorded and placed in flowsheets - 112.5#.   Pt propelled herself back to her  room at mod I level. Met with TR and had therapy dog, Dixie, visit patient to help with calming - patient appreciative. Ended session with all needs met.       Therapy Documentation Precautions:  Precautions Precautions: Posterior Hip, Fall Required Braces or Orthoses: Knee Immobilizer - Right Knee Immobilizer - Right: Other (comment) (for comfort) Restrictions Weight Bearing Restrictions: Yes RLE Weight Bearing: Touchdown weight bearing LLE Weight Bearing: Weight bearing as tolerated Other Position/Activity Restrictions: LLE WBAT for transfers only x 4 weeks General:      Therapy/Group: Individual Therapy  Orrin Brigham 02/02/2023, 7:52 AM

## 2023-02-02 NOTE — Plan of Care (Signed)
  Problem: RH Furniture Transfers Goal: LTG Patient will perform furniture transfers w/assist (OT/PT) Description: LTG: Patient will perform furniture transfers  with assistance (OT/PT). Outcome: Completed/Met   Problem: RH Balance Goal: LTG Patient will maintain dynamic sitting balance (PT) Description: LTG:  Patient will maintain dynamic sitting balance with assistance during mobility activities (PT) Outcome: Completed/Met   Problem: Sit to Stand Goal: LTG:  Patient will perform sit to stand with assistance level (PT) Description: LTG:  Patient will perform sit to stand with assistance level (PT) Outcome: Completed/Met   Problem: RH Bed Mobility Goal: LTG Patient will perform bed mobility with assist (PT) Description: LTG: Patient will perform bed mobility with assistance, with/without cues (PT). Outcome: Completed/Met   Problem: RH Bed to Chair Transfers Goal: LTG Patient will perform bed/chair transfers w/assist (PT) Description: LTG: Patient will perform bed to chair transfers with assistance (PT). Outcome: Completed/Met   Problem: RH Furniture Transfers Goal: LTG Patient will perform furniture transfers w/assist (OT/PT) Description: LTG: Patient will perform furniture transfers  with assistance (OT/PT). Outcome: Completed/Met   Problem: RH Wheelchair Mobility Goal: LTG Patient will propel w/c in controlled environment (PT) Description: LTG: Patient will propel wheelchair in controlled environment, # of feet with assist (PT) Outcome: Completed/Met Goal: LTG Patient will propel w/c in home environment (PT) Description: LTG: Patient will propel wheelchair in home environment, # of feet with assistance (PT). Outcome: Completed/Met
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
## 2023-02-02 NOTE — Progress Notes (Signed)
Physical Therapy Discharge Summary  Patient Details  Name: Kyleena Scheirer MRN: [id number] Date of Birth: 2000/10/24  Date of Discharge from PT service:February 02, 2023   Patient has met 7 of 7 long term goals due to improved activity tolerance, increased strength, decreased pain, and ability to compensate for deficits.  Patient to discharge at a wheelchair level Modified Independent.   Patient's care partner is independent to provide the necessary physical assistance at discharge. Functional mobility progress limited by WB restrictions and therefore discharged at wheelchair level.   Reasons goals not met: n/a  Recommendation:  Patient will benefit from ongoing skilled PT services in outpatient setting once her WB restrictions are lifted to continue to advance safe functional mobility, address ongoing impairments in functional transfers, gait training (when WB restrictions are lifted), generalized weakness and deconditioning, and minimize fall risk.  Equipment: Manual wheelchair with ELR and flip back removable arm rests  Reasons for discharge: treatment goals met and discharge from hospital  Patient/family agrees with progress made and goals achieved: Yes  PT Discharge Precautions/Restrictions Precautions Precautions: Posterior Hip;Fall Precaution Booklet Issued: Yes (comment) (handout provided) Restrictions Weight Bearing Restrictions: Yes RLE Weight Bearing: Touchdown weight bearing LLE Weight Bearing: Weight bearing as tolerated Other Position/Activity Restrictions: LLE WBAT for transfers only x 4 weeks Pain Pain Assessment Pain Scale: 0-10 Pain Score: 0-No pain Pain Type: Acute pain Pain Location: Hip Pain Orientation: Right Pain Descriptors / Indicators: Discomfort Pain Onset: On-going Patients Stated Pain Goal: 2 Pain Intervention(s): Repositioned;Environmental changes Multiple Pain Sites: No Pain Interference Pain Interference Pain Effect on Sleep: 2.  Occasionally Pain Interference with Therapy Activities: 2. Occasionally Pain Interference with Day-to-Day Activities: 2. Occasionally Vision/Perception  Vision - History Ability to See in Adequate Light: 0 Adequate Perception Perception: Within Functional Limits Praxis Praxis: Intact  Cognition Overall Cognitive Status: Within Functional Limits for tasks assessed Arousal/Alertness: Awake/alert Orientation Level: Oriented X4 Attention: Alternating Memory: Appears intact Awareness: Appears intact Problem Solving: Appears intact Behaviors: Other (comment) (anxious) Safety/Judgment: Appears intact Sensation Sensation Light Touch: Appears Intact Hot/Cold: Appears Intact Proprioception: Appears Intact Stereognosis: Appears Intact Coordination Gross Motor Movements are Fluid and Coordinated: Yes Fine Motor Movements are Fluid and Coordinated: Yes Coordination and Movement Description: Limited by WB restrictions and pelvic discomfort with mobility Motor  Motor Motor: Within Functional Limits  Mobility Bed Mobility Bed Mobility: Supine to Sit;Sit to Supine Supine to Sit: Independent Sit to Supine: Independent Transfers Transfers: Sit to Stand;Stand to Sit;Stand Pivot Transfers;Squat Pivot Transfers Sit to Stand: Independent with assistive device Stand to Sit: Independent with assistive device Stand Pivot Transfers: Independent with assistive device Squat Pivot Transfers: Independent with assistive device Transfer (Assistive device): None Locomotion  Gait Ambulation: No (WB restrictions) Gait Gait: No Stairs / Additional Locomotion Stairs: Yes Stairs Assistance: Dependent - Patient 0% Stair Management Technique: Wheelchair Number of Stairs: 4 Height of Stairs: 6 Ramp: Independent with assistive device Pick up small object from the floor (from standing position) activity did not occur: Safety/medical concerns Naval architect Mobility: Yes Wheelchair  Assistance: Independent with Scientist, research (life sciences): Both upper extremities Wheelchair Parts Management: Independent Distance: 200+  Trunk/Postural Assessment  Cervical Assessment Cervical Assessment: Within Functional Limits Thoracic Assessment Thoracic Assessment: Within Functional Limits Lumbar Assessment Lumbar Assessment: Exceptions to [facility] Postural Control Postural Control: Within Functional Limits  Balance Balance Balance Assessed: Yes Dynamic Sitting Balance Dynamic Sitting - Balance Support: No upper extremity supported Dynamic Sitting - Level of Assistance: 6: Modified independent (Device/Increase time) Static Standing  Balance Static Standing - Balance Support: During functional activity;Right upper extremity supported;Left upper extremity supported Static Standing - Level of Assistance: 6: Modified independent (Device/Increase time) Extremity Assessment  RUE Assessment RUE Assessment: Within Functional Limits LUE Assessment LUE Assessment: Within Functional Limits RLE Assessment RLE Assessment: Exceptions to [facility] RLE Strength RLE Overall Strength: Due to pain Right Hip Flexion: 2/5 Right Hip Extension: 2+/5 Right Hip ABduction: 2/5 Right Hip ADduction: 2/5 Right Knee Flexion: 3+/5 Right Knee Extension: 4-/5 Right Ankle Dorsiflexion: 5/5 LLE Assessment LLE Assessment: Within Functional Limits   Summit Borchardt P Hannahmarie Asberry  PT, DPT, CSRS  02/02/2023, 11:31 AM

## 2023-02-02 NOTE — Progress Notes (Signed)
PROGRESS NOTE   Subjective/Complaints: Ortho consulted regarding Xrs prior to d/c, ordered, regarding when sutures can be removed, would like to keep for another 7-10 days  ROS: does not like nutritional supplements, pain is well controlled, +constipation, +anxiety as per PT Objective:   No results found. Recent Labs    01/31/23 0922  WBC 7.5  HGB 9.9*  HCT 32.8*  PLT 586*   Recent Labs    01/31/23 0922  NA 136  K 3.8  CL 104  CO2 22  GLUCOSE 88  BUN 7  CREATININE 0.67  CALCIUM 9.4    Intake/Output Summary (Last 24 hours) at 02/02/2023 1114 Last data filed at 02/01/2023 1841 Gross per 24 hour  Intake 240 ml  Output --  Net 240 ml        Physical Exam: Vital Signs Blood pressure 110/70, pulse 86, temperature 98.2 F (36.8 C), temperature source Oral, resp. rate 15, height 5\' 2"  (1.575 m), weight 51 kg, last menstrual period 01/08/2023, SpO2 99 %. Gen: no distress, normal appearing. BMI 20.58 HEENT: oral mucosa pink and moist, NCAT Cardio: Tachycardia, hypotensive Chest: normal effort, normal rate of breathing Abd: soft, non-distended Ext: no edema Psych: pleasant, normal affect Skin: Multiple bilateral lower extremity abrasions, covered in Mepilex, no apparent drainage. MSK:      No apparent deformity.      Strength: All 4 limbs antigravity and against resistance in bed         Neurologic exam:  Cognition: AAO to person, place, time.  No apparent deficits.   Assessment/Plan: 1. Functional deficits which require 3+ hours per day of interdisciplinary therapy in a comprehensive inpatient rehab setting. Physiatrist is providing close team supervision and 24 hour management of active medical problems listed below. Physiatrist and rehab team continue to assess barriers to discharge/monitor patient progress toward functional and medical goals  Care Tool:  Bathing    Body parts bathed by patient: Right  arm, Left arm, Chest, Abdomen, Front perineal area, Right upper leg, Left upper leg, Face   Body parts bathed by helper: Buttocks, Right lower leg, Left lower leg     Bathing assist Assist Level: Maximal Assistance - Patient 24 - 49%     Upper Body Dressing/Undressing Upper body dressing   What is the patient wearing?: Pull over shirt    Upper body assist Assist Level: Contact Guard/Touching assist    Lower Body Dressing/Undressing Lower body dressing      What is the patient wearing?: Underwear/pull up, Pants     Lower body assist Assist for lower body dressing: Moderate Assistance - Patient 50 - 74%     Toileting Toileting    Toileting assist Assist for toileting: Minimal Assistance - Patient > 75%     Transfers Chair/bed transfer  Transfers assist     Chair/bed transfer assist level: Set up assist     Locomotion Ambulation   Ambulation assist   Ambulation activity did not occur: Safety/medical concerns (WB restrictions)          Walk 10 feet activity   Assist  Walk 10 feet activity did not occur: Safety/medical concerns  Walk 50 feet activity   Assist Walk 50 feet with 2 turns activity did not occur: Safety/medical concerns         Walk 150 feet activity   Assist Walk 150 feet activity did not occur: Safety/medical concerns         Walk 10 feet on uneven surface  activity   Assist Walk 10 feet on uneven surfaces activity did not occur: Safety/medical concerns         Wheelchair     Assist Is the patient using a wheelchair?: Yes Type of Wheelchair: Manual    Wheelchair assist level: Independent Max wheelchair distance: 200+    Wheelchair 50 feet with 2 turns activity    Assist        Assist Level: Independent   Wheelchair 150 feet activity     Assist      Assist Level: Independent   Blood pressure 110/70, pulse 86, temperature 98.2 F (36.8 C), temperature source Oral, resp. rate 15,  height 5\' 2"  (1.575 m), weight 51 kg, last menstrual period 01/08/2023, SpO2 99 %.  Medical Problem List and Plan: 1. Functional deficits secondary to pelvic fracture s/p pelvic screw fixation and ORIF by Dr. Carola Frost 6/17             -patient may shower with surgical site covered             -ELOS/Goals: 6 days, modified independent PT/OT             - TDWB on the RLE and WBAT on the LLE   Grounds pass ordered  Discussed patient's desire to d/c on Wednesday, discussed that we can do caregiver education Wednesday and have patient d/c on Thurs 2.  Antithrombotics: -DVT/anticoagulation:  Pharmaceutical: Lovenox 30 mg BID             -antiplatelet therapy: none             -ortho recs: anticoagulation for 4 weeks, discussed with nursing   3. Pain Management: continue Tylenol scheduled; Robaxin and oxycodone as needed   4. Mood/Behavior/Sleep: LCSW to evaluate and provide emotional support             -antipsychotic agents: n/a             -continue Remeron 7.5 mg q HS (depression, sedation, appetite stim)             - IVC lifted 6/21; no current restraints needed.  Recommend neuropsych consult with Dr. Kieth Brightly  -Sleeping poorly at night due to spasms, pain in lower extremities; scheduled Robaxin and melatonin nightly.   5. Neuropsych/cognition: This patient is capable of making decisions on her own behalf.   6. Skin/Wound Care: Routine skin care checks             -monitor surgical incisions>>change Meplilex dressings daily prn             -Bacitacin for road rash   7. Fluids/Electrolytes/Nutrition: Routine Is and Os and follow-up chemistries             -continue Vitamin D, B1, C, D3, MVI  -Labs stable   8: Vitamin D deficiency: continue supplementation   9: Anemia; acute/chronic blood loss and history of iron deficiency             -s/p 1 unit PRBCs 6/21             -follow-up CBC - stable 8.5; some mild reactive thrombocytosis, monitor.  10: Right hip dislocation and  acetabular fracture s/p closed reduction, traction; screw fixation, ORIF Dr. Carola Frost 6/17             -TDWB R leg x 8 weeks then graduated WB thereafter              -WBAT L leg for transfers x 4 weeks then progress             -bed to chair transfers only for the first 4 weeks post op              -posterior hip precautions R hip x 12 weeks              -no return to work for ~2 months  Repeat Xrs today   11: Retained vaginal tampon/Chlamydia infection; azithromycin 1 g given             -continue flagyl 500 mg BID for 7 days (end 6/25)   12: Hyponatremia: reviewed and has normalized. Monitor on Mondays.  Stable.   13: Alcohol use: cessation counseling, continue thiamine. Restart magnesium gluconate 250mg  HS.   14. Tachycardia: restart magnesium gluconate 250mg  HS -resolved  15. Hypocalcemia: continue prenatal multivitamin  16. Hypotension: resolved, continue to monitor vitals  17. Anxiety: restart magnesium gluconate 250mg  HS    >50 minutes spent in review of her vitals, noted improved blood pressure, discussed anxiety with PT, magnesium glucoante 250mg  HS started, consulted ortho for Xrs, when sutures can be removed  LOS: 5 days A FACE TO FACE EVALUATION WAS PERFORMED  Patricia [name] P Dhwani Venkatesh 02/02/2023, 11:14 AM

## 2023-02-02 NOTE — Progress Notes (Signed)
Inpatient Rehabilitation Discharge Medication Review by a Pharmacist  A complete drug regimen review was completed for this patient to identify any potential clinically significant medication issues.  High Risk Drug Classes Is patient taking? Indication by Medication  Antipsychotic {Receiving?:26196}   Anticoagulant {Receiving?:26196}   Antibiotic {Receiving?:26196}   Opioid {Receiving?:26196}   Antiplatelet {Receiving?:26196}   Hypoglycemics/insulin {Yes or No?:26198}   Vasoactive Medication {Receiving?:26196}   Chemotherapy {Receiving Chemo?:26197}   Other {Yes or No?:26198} Apixaban - ortho ppx MVI, Vit D, Vit C, thiamine - supplement Magonate, melatonin - sleep Methocarbamol - muscle spasms Mirtrazapine - mood, sleep Oxycodone - PRN pain     Type of Medication Issue Identified Description of Issue Recommendation(s)  Drug Interaction(s) (clinically significant)     Duplicate Therapy     Allergy     No Medication Administration End Date     Incorrect Dose     Additional Drug Therapy Needed     Significant med changes from prior encounter (inform family/care partners about these prior to discharge).  Communicate medication changes with patient/family at discharge  Other       Clinically significant medication issues were identified that warrant physician communication and completion of prescribed/recommended actions by midnight of the next day:  {Yes or No?:26198}  Name of provider notified for urgent issues identified: ***   Provider Method of Notification: ***    Pharmacist comments: ***   Time spent performing this drug regimen review (minutes): 20   Thank you for allowing pharmacy to be a part of this patient's care.  Thelma Barge, PharmD Clinical Pharmacist

## 2023-02-02 NOTE — Progress Notes (Signed)
Recreational Therapy Session Note  Patient Details  Name: Anwen Cannedy MRN: [id number] Date of Birth: [birth year] Today's Date: 02/02/2023  Pain:no c/o  Pt participated in animal assisted activity seated w/c level with supervision.  Pt easily engaged in conversation with pet partner team and was appreciative of this visit.  Abbrielle Batts 02/02/2023, 4:02 PM

## 2023-02-03 DIAGNOSIS — S72001A Fracture of unspecified part of neck of right femur, initial encounter for closed fracture: Secondary | ICD-10-CM
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
## 2023-02-03 NOTE — Progress Notes (Signed)
Patient discharged to home, accompanied by her family. 

## 2023-02-03 NOTE — Progress Notes (Signed)
PROGRESS NOTE   Subjective/Complaints: No new complaints this morning She asks when she can leave today Discussed suture removal in 7-10 days Discussed f/u with me  ROS: does not like nutritional supplements, pain is well controlled, +constipation- improved, +anxiety as per PT Objective:   DG Pelvis Comp Min 3V  Result Date: 02/02/2023 CLINICAL DATA:  Pelvic ring fracture, follow-up examination EXAM: JUDET PELVIS - 3+ VIEW COMPARISON:  01/24/2023 FINDINGS: Bilateral sacroiliac arthrodesis with 2 transverse lag screws and right anterior and posterior column ORIF with malleable plate and screw fixation are again identified. Mildly displaced right inferior and left superior and inferior pubic rami fractures again identified. Left superior pubic ramus fracture appears segmental but does not appear to involve the left acetabular articular surface. Stable alignment. No interval fracture. No dislocation. Joint spaces are preserved. IMPRESSION: 1. Stable bilateral sacroiliac arthrodesis and right anterior and posterior column ORIF. 2. Mildly displaced right inferior and left superior and inferior pubic rami fractures. Stable alignment. Electronically Signed   By: Helyn Numbers M.D.   On: 02/02/2023 20:51   No results for input(s): "WBC", "HGB", "HCT", "PLT" in the last 72 hours.  No results for input(s): "NA", "K", "CL", "CO2", "GLUCOSE", "BUN", "CREATININE", "CALCIUM" in the last 72 hours.   Intake/Output Summary (Last 24 hours) at 02/03/2023 1021 Last data filed at 02/03/2023 0900 Gross per 24 hour  Intake 240 ml  Output --  Net 240 ml        Physical Exam: Vital Signs Blood pressure 109/65, pulse 91, temperature 98.6 F (37 C), resp. rate 15, height 5\' 2"  (1.575 m), weight 55.3 kg, last menstrual period 01/08/2023, SpO2 100 %. Gen: no distress, normal appearing. BMI 20.58 Gen: no distress, normal appearing HEENT: oral mucosa pink  and moist, NCAT Cardio: Reg rate Chest: normal effort, normal rate of breathing Abd: soft, non-distended Ext: no edema Psych: pleasant, normal affect Skin: Multiple bilateral lower extremity abrasions, covered in Mepilex, no apparent drainage. MSK:      No apparent deformity.      Strength: All 4 limbs antigravity and against resistance in bed         Neurologic exam:  Cognition: AAO to person, place, time.  No apparent deficits.   Assessment/Plan: 1. Functional deficits which require 3+ hours per day of interdisciplinary therapy in a comprehensive inpatient rehab setting. Physiatrist is providing close team supervision and 24 hour management of active medical problems listed below. Physiatrist and rehab team continue to assess barriers to discharge/monitor patient progress toward functional and medical goals  Care Tool:  Bathing    Body parts bathed by patient: Right arm, Left arm, Chest, Abdomen, Front perineal area, Right upper leg, Left upper leg, Face, Right lower leg, Left lower leg, Buttocks   Body parts bathed by helper: Buttocks, Right lower leg, Left lower leg     Bathing assist Assist Level: Independent with assistive device Assistive Device Comment: reacher, tub bench, grab bar   Upper Body Dressing/Undressing Upper body dressing   What is the patient wearing?: Pull over shirt    Upper body assist Assist Level: Independent    Lower Body Dressing/Undressing Lower body dressing  What is the patient wearing?: Underwear/pull up, Pants     Lower body assist Assist for lower body dressing: Independent with assitive device Assistive Device Comment: Lobbyist    Toileting assist Assist for toileting: Independent with assistive device Assistive Device Comment: grab bar   Transfers Chair/bed transfer  Transfers assist     Chair/bed transfer assist level: Set up assist     Locomotion Ambulation   Ambulation assist    Ambulation activity did not occur: Safety/medical concerns (WB restrictions)          Walk 10 feet activity   Assist  Walk 10 feet activity did not occur: Safety/medical concerns        Walk 50 feet activity   Assist Walk 50 feet with 2 turns activity did not occur: Safety/medical concerns         Walk 150 feet activity   Assist Walk 150 feet activity did not occur: Safety/medical concerns         Walk 10 feet on uneven surface  activity   Assist Walk 10 feet on uneven surfaces activity did not occur: Safety/medical concerns         Wheelchair     Assist Is the patient using a wheelchair?: Yes Type of Wheelchair: Manual    Wheelchair assist level: Independent Max wheelchair distance: 200+    Wheelchair 50 feet with 2 turns activity    Assist        Assist Level: Independent   Wheelchair 150 feet activity     Assist      Assist Level: Independent   Blood pressure 109/65, pulse 91, temperature 98.6 F (37 C), resp. rate 15, height 5\' 2"  (1.575 m), weight 55.3 kg, last menstrual period 01/08/2023, SpO2 100 %.  Medical Problem List and Plan: 1. Functional deficits secondary to pelvic fracture s/p pelvic screw fixation and ORIF by Dr. Carola Frost 6/17             -patient may shower with surgical site covered             -ELOS/Goals: 6 days, modified independent PT/OT             - TDWB on the RLE and WBAT on the LLE   Grounds pass ordered  Discussed patient's desire to d/c on Wednesday, discussed that we can do caregiver education Wednesday and have patient d/c on Thurs 2.  Antithrombotics: -DVT/anticoagulation:  Pharmaceutical: Lovenox 30 mg BID             -antiplatelet therapy: none             -ortho recs: anticoagulation for 4 weeks, discussed with nursing   3. Pain Management: continue Tylenol scheduled; Robaxin and oxycodone as needed   4. Mood/Behavior/Sleep: LCSW to evaluate and provide emotional support              -antipsychotic agents: n/a             -continue Remeron 7.5 mg q HS (depression, sedation, appetite stim)             - IVC lifted 6/21; no current restraints needed.  Recommend neuropsych consult with Dr. Kieth Brightly  -Sleeping poorly at night due to spasms, pain in lower extremities; scheduled Robaxin and melatonin nightly.   5. Neuropsych/cognition: This patient is capable of making decisions on her own behalf.   6. Skin/Wound Care: Routine skin care checks             -  monitor surgical incisions>>change Meplilex dressings daily prn             -Bacitacin for road rash   7. Fluids/Electrolytes/Nutrition: Routine Is and Os and follow-up chemistries             -continue Vitamin D, B1, C, D3, MVI  -Labs stable   8: Vitamin D deficiency: continue supplementation   9: Anemia; acute/chronic blood loss and history of iron deficiency             -s/p 1 unit PRBCs 6/21             -follow-up CBC - stable 8.5; some mild reactive thrombocytosis, monitor.   10: Right hip dislocation and acetabular fracture s/p closed reduction, traction; screw fixation, ORIF Dr. Carola Frost 6/17             -TDWB R leg x 8 weeks then graduated WB thereafter              -WBAT L leg for transfers x 4 weeks then progress             -bed to chair transfers only for the first 4 weeks post op              -posterior hip precautions R hip x 12 weeks              -no return to work for ~2 months  Repeat Xrs today   11: Retained vaginal tampon/Chlamydia infection; azithromycin 1 g given             -completed flagyl 500 mg BID for 7 days (end 6/25)   12: Hyponatremia: reviewed and has normalized. Monitor on Mondays.  Stable.   13: Alcohol use: cessation counseling, continue thiamine. Continue magnesium gluconate 250mg  HS.   14. Tachycardia: continue magnesium gluconate 250mg  HS -resolved  15. Hypocalcemia: continue prenatal multivitamin  16. Hypotension: resolved, continue to monitor vitals  17. Anxiety: restart  magnesium gluconate 250mg  HS, continue as it appears to help  18. Constipation: messaged nursing to find out when last BM was   >30 minutes spent in discharge of patient including review of medications and follow-up appointments, physical examination, and in answering all patient's questions   LOS: 6 days A FACE TO FACE EVALUATION WAS PERFORMED  Clint Bolder P Wendelin Bradt 02/03/2023, 10:21 AM

## 2023-02-07 ENCOUNTER — Encounter (HOSPITAL_COMMUNITY): Payer: Self-pay

## 2023-02-07 ENCOUNTER — Telehealth: Payer: Self-pay

## 2023-02-07 ENCOUNTER — Ambulatory Visit (HOSPITAL_COMMUNITY)
Admission: RE | Admit: 2023-02-07 | Discharge: 2023-02-07 | Disposition: A | Discharge status: Home / Self Care | Payer: Medicaid Other | Source: Ambulatory Visit | Attending: Internal Medicine | Admitting: Internal Medicine

## 2023-02-07 VITALS — BP 93/60 | HR 101 | Temp 97.7°F | Resp 18

## 2023-02-07 DIAGNOSIS — Z113 Encounter for screening for infections with a predominantly sexual mode of transmission: Secondary | ICD-10-CM | POA: Diagnosis not present

## 2023-02-07 NOTE — ED Triage Notes (Signed)
Pt is here for STD testing. Pt denies any symptoms at this time.
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
## 2023-02-07 NOTE — Transitions of Care (Post Inpatient/ED Visit) (Unsigned)
   02/07/2023  Name: Patricia [name] MRN: [id number] DOB: 2001/02/09  Today's TOC FU Call Status: Today's TOC FU Call Status:: Unsuccessul Call (1st Attempt) Unsuccessful Call (1st Attempt) Date: 02/07/23  Attempted to reach the patient regarding the most recent Inpatient/ED visit.  Follow Up Plan: Additional outreach attempts will be made to reach the patient to complete the Transitions of Care (Post Inpatient/ED visit) call.   Signature Karena Addison, LPN [facility] Nurse Health Advisor Direct Dial [phone]

## 2023-02-07 NOTE — Discharge Instructions (Signed)
Will call you with recommendations if labs are abnormal If your STD screen is positive for chlamydia, it may be a false positive and you will need to return to the clinic in 6 weeks for retesting.

## 2023-02-08 LAB — CERVICOVAGINAL ANCILLARY ONLY
Bacterial Vaginitis (gardnerella): POSITIVE — AB
Candida Glabrata: NEGATIVE
Candida Vaginitis: NEGATIVE
Chlamydia: NEGATIVE
Comment: NEGATIVE
Comment: NEGATIVE
Comment: NEGATIVE
Comment: NEGATIVE
Comment: NEGATIVE
Comment: NORMAL
Neisseria Gonorrhea: NEGATIVE
Trichomonas: NEGATIVE

## 2023-02-08 MED FILL — Fentanyl Citrate Preservative Free (PF) Inj 100 MCG/2ML: INTRAMUSCULAR | Qty: 1 | Status: AC

## 2023-02-08 NOTE — Transitions of Care (Post Inpatient/ED Visit) (Signed)
   02/08/2023  Name: Patricia [name] MRN: [id number] DOB: 20-Apr-2001  Today's TOC FU Call Status: Today's TOC FU Call Status:: Unsuccessful Call (2nd Attempt) Unsuccessful Call (1st Attempt) Date: 02/07/23 Unsuccessful Call (2nd Attempt) Date: 02/08/23  Attempted to reach the patient regarding the most recent Inpatient/ED visit.  Follow Up Plan: Additional outreach attempts will be made to reach the patient to complete the Transitions of Care (Post Inpatient/ED visit) call.   Signature Karena Addison, LPN [facility] Nurse Health Advisor Direct Dial [phone]

## 2023-02-09 ENCOUNTER — Telehealth (HOSPITAL_COMMUNITY): Payer: Self-pay | Admitting: Emergency Medicine

## 2023-02-09 MED ORDER — METRONIDAZOLE 500 MG PO TABS: 500.0000 mg | ORAL_TABLET | Freq: Two times a day (BID) | ORAL | 0 refills | Status: DC

## 2023-02-09 NOTE — Transitions of Care (Post Inpatient/ED Visit) (Signed)
   02/09/2023  Name: Patricia [name] MRN: [id number] DOB: 10-25-2000  Today's TOC FU Call Status: Today's TOC FU Call Status:: Unsuccessful Call (3rd Attempt) Unsuccessful Call (1st Attempt) Date: 02/07/23 Unsuccessful Call (2nd Attempt) Date: 02/08/23 Unsuccessful Call (3rd Attempt) Date: 02/09/23  Attempted to reach the patient regarding the most recent Inpatient/ED visit.  Follow Up Plan: No further outreach attempts will be made at this time. We have been unable to contact the patient.  Signature Karena Addison, LPN [facility] Nurse Health Advisor Direct Dial [phone]

## 2023-02-11 ENCOUNTER — Ambulatory Visit: Payer: Medicaid Other | Admitting: Family Medicine
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
## 2023-02-23 DIAGNOSIS — S32481D Displaced dome fracture of right acetabulum, subsequent encounter for fracture with routine healing: Secondary | ICD-10-CM | POA: Diagnosis not present

## 2023-02-23 DIAGNOSIS — S73004D Unspecified dislocation of right hip, subsequent encounter: Secondary | ICD-10-CM | POA: Diagnosis not present

## 2023-02-23 DIAGNOSIS — S32811D Multiple fractures of pelvis with unstable disruption of pelvic ring, subsequent encounter for fracture with routine healing: Secondary | ICD-10-CM | POA: Diagnosis not present

## 2023-03-03 DIAGNOSIS — S32401A Unspecified fracture of right acetabulum, initial encounter for closed fracture: Secondary | ICD-10-CM | POA: Diagnosis not present

## 2023-03-03 DIAGNOSIS — S3282XA Multiple fractures of pelvis without disruption of pelvic ring, initial encounter for closed fracture: Secondary | ICD-10-CM | POA: Diagnosis not present

## 2023-03-03 DIAGNOSIS — S72001A Fracture of unspecified part of neck of right femur, initial encounter for closed fracture: Secondary | ICD-10-CM | POA: Diagnosis not present

## 2023-03-08 ENCOUNTER — Encounter: Payer: Medicaid Other | Admitting: Physical Medicine and Rehabilitation

## 2023-04-03 DIAGNOSIS — S3282XA Multiple fractures of pelvis without disruption of pelvic ring, initial encounter for closed fracture: Secondary | ICD-10-CM | POA: Diagnosis not present

## 2023-04-03 DIAGNOSIS — S32401A Unspecified fracture of right acetabulum, initial encounter for closed fracture: Secondary | ICD-10-CM | POA: Diagnosis not present

## 2023-04-03 DIAGNOSIS — S72001A Fracture of unspecified part of neck of right femur, initial encounter for closed fracture: Secondary | ICD-10-CM | POA: Diagnosis not present

## 2023-05-04 DIAGNOSIS — S72001A Fracture of unspecified part of neck of right femur, initial encounter for closed fracture: Secondary | ICD-10-CM | POA: Diagnosis not present

## 2023-05-04 DIAGNOSIS — S32401A Unspecified fracture of right acetabulum, initial encounter for closed fracture: Secondary | ICD-10-CM | POA: Diagnosis not present

## 2023-05-04 DIAGNOSIS — S3282XA Multiple fractures of pelvis without disruption of pelvic ring, initial encounter for closed fracture: Secondary | ICD-10-CM | POA: Diagnosis not present

## 2023-06-03 DIAGNOSIS — S32401A Unspecified fracture of right acetabulum, initial encounter for closed fracture: Secondary | ICD-10-CM | POA: Diagnosis not present

## 2023-06-03 DIAGNOSIS — S3282XA Multiple fractures of pelvis without disruption of pelvic ring, initial encounter for closed fracture: Secondary | ICD-10-CM | POA: Diagnosis not present

## 2023-06-03 DIAGNOSIS — S72001A Fracture of unspecified part of neck of right femur, initial encounter for closed fracture: Secondary | ICD-10-CM | POA: Diagnosis not present

## 2023-07-04 DIAGNOSIS — S32401A Unspecified fracture of right acetabulum, initial encounter for closed fracture: Secondary | ICD-10-CM | POA: Diagnosis not present

## 2023-07-04 DIAGNOSIS — S72001A Fracture of unspecified part of neck of right femur, initial encounter for closed fracture: Secondary | ICD-10-CM | POA: Diagnosis not present

## 2023-07-04 DIAGNOSIS — S3282XA Multiple fractures of pelvis without disruption of pelvic ring, initial encounter for closed fracture: Secondary | ICD-10-CM | POA: Diagnosis not present

## 2023-07-22 ENCOUNTER — Ambulatory Visit (HOSPITAL_COMMUNITY): Payer: Medicaid Other

## 2023-07-22 ENCOUNTER — Ambulatory Visit (HOSPITAL_COMMUNITY)
Admission: RE | Admit: 2023-07-22 | Discharge: 2023-07-22 | Disposition: A | Discharge status: Home / Self Care | Payer: Medicaid Other | Source: Ambulatory Visit | Attending: Emergency Medicine | Admitting: Emergency Medicine

## 2023-07-22 ENCOUNTER — Encounter (HOSPITAL_COMMUNITY): Payer: Self-pay

## 2023-07-22 VITALS — BP 118/78 | HR 68 | Temp 98.4°F | Resp 18

## 2023-07-22 DIAGNOSIS — B9689 Other specified bacterial agents as the cause of diseases classified elsewhere: Secondary | ICD-10-CM | POA: Insufficient documentation

## 2023-07-22 DIAGNOSIS — Z113 Encounter for screening for infections with a predominantly sexual mode of transmission: Secondary | ICD-10-CM | POA: Insufficient documentation

## 2023-07-22 DIAGNOSIS — N898 Other specified noninflammatory disorders of vagina: Secondary | ICD-10-CM | POA: Insufficient documentation

## 2023-07-22 DIAGNOSIS — N760 Acute vaginitis: Secondary | ICD-10-CM | POA: Insufficient documentation

## 2023-07-22 MED ORDER — METRONIDAZOLE 500 MG PO TABS: 500.0000 mg | ORAL_TABLET | Freq: Two times a day (BID) | ORAL | 0 refills | Status: AC

## 2023-07-22 NOTE — ED Triage Notes (Signed)
Pt c/o vaginal discharge with odor for almost a month. States dx'd with BV when this started but didn't finish taking the antibiotics.

## 2023-07-22 NOTE — Discharge Instructions (Signed)
We will call you if anything on your swab returns positive. You can also see these results on MyChart. Please abstain from sexual intercourse until your results return.  I am treating you for BV - the flagyl is sent to your pharmacy. Take as prescribed. Do not drink alcohol while taking  Ob/gyn clinics below - call to make appointment  You can scan the QR code on the last page to get established with a primary care provider

## 2023-07-22 NOTE — ED Provider Notes (Signed)
[facility]    CSN: [id number] Arrival date & time: 07/22/23  1232      History   Chief Complaint Chief Complaint  Patient presents with   SEXUALLY TRANSMITTED DISEASE    HPI Patricia [name] is a 22 y.o. female.  Here with vaginal discharge and odor for a month Has history of BV similar to this Denies exposure to STD but would like testing  No urinary symptoms LMP 11/16  Past Medical History:  Diagnosis Date   Anemia    Depression    Suicide attempt by drug ingestion [facility])    Tuberculosis 2022    Patient Active Problem List   Diagnosis Date Noted   Recurrent major depression in partial remission (HCC) 02/02/2023   Closed fracture of right acetabulum (HCC) 01/28/2023   Closed right hip fracture (HCC) 01/28/2023   Bacterial vaginosis 01/25/2023   Retained tampon 01/25/2023   Heterotopic ossification 01/25/2023   Chlamydia 01/25/2023   Multiple pelvic fractures (HCC) 01/22/2023   Adjustment disorder with mixed anxiety and depressed mood 01/21/2023   Vaginal discharge 01/29/2022   Irregular periods 02/02/2020   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   Right upper lobe pulmonary infiltrate 01/28/2020   Supraclavicular adenopathy    Positive QuantiFERON-TB Gold test    Weight loss 01/24/2020   Anemia 01/23/2020   Suicide attempt by placing self in path of moving vehicle [facility])    Depression 05/24/2015   Overdose 05/23/2015   Suicide attempt by acetaminophen overdose (HCC) 05/23/2015    Past Surgical History:  Procedure Laterality Date   BRONCHIAL BRUSHINGS  01/30/2020   Procedure: BRONCHIAL BRUSHINGS;  Surgeon: Lorin Glass, MD;  Location: [facility] ENDOSCOPY;  Service: Pulmonary;;   BRONCHIAL NEEDLE ASPIRATION BIOPSY  01/30/2020   Procedure: BRONCHIAL NEEDLE ASPIRATION BIOPSIES;  Surgeon: Lorin Glass, MD;  Location: [facility] ENDOSCOPY;  Service: Pulmonary;;   BRONCHIAL WASHINGS  01/30/2020   Procedure: BRONCHIAL WASHINGS;  Surgeon: Lorin Glass, MD;  Location[facility]  ENDOSCOPY;  Service: Pulmonary;;   CLOSED REDUCTION FINGER WITH PERCUTANEOUS PINNING  01/22/2023   Procedure: CLOSED REDUCTION FINGER WITH PERCUTANEOUS PINNING;  Surgeon: Joen Laura, MD;  [facility] OR;  Service: Orthopedics;;   FOREIGN BODY REMOVAL VAGINAL  01/24/2023   Procedure: REMOVAL FOREIGN BODY VAGINAL;  Surgeon: Virgil Bing, MD;  [facility] OR;  Service: Gynecology;;   HIP CLOSED REDUCTION Right 01/22/2023   Procedure: CLOSED REDUCTION HIP;  Surgeon: Joen Laura, MD;  [facility] OR;  Service: Orthopedics;  Laterality: Right;   INSERTION OF TRACTION PIN Right 01/22/2023   Procedure: SKELETAL TRACTION APPLICATION;  Surgeon: Joen Laura, MD;  [facility] OR;  Service: Orthopedics;  Laterality: Right;   ORIF ACETABULAR FRACTURE Right 01/24/2023   Procedure: OPEN REDUCTION INTERNAL FIXATION (ORIF) ACETABULAR FRACTURE AND PELVIC RING FRACTURE;  Surgeon: Myrene Galas, MD;  [facility] OR;  Service: Orthopedics;  Laterality: Right;   VIDEO BRONCHOSCOPY WITH ENDOBRONCHIAL ULTRASOUND N/A 01/30/2020   Procedure: VIDEO BRONCHOSCOPY WITH ENDOBRONCHIAL ULTRASOUND;  Surgeon: Lorin Glass, MD;  Location: [facility] ENDOSCOPY;  Service: Pulmonary;  Laterality: N/A;    OB History   No obstetric history on file.      Home Medications    Prior to Admission medications   Medication Sig Start Date End Date Taking? Authorizing Provider  metroNIDAZOLE (FLAGYL) 500 MG tablet Take 1 tablet (500 mg total) by mouth 2 (two) times daily for 7 days. 07/22/23 07/29/23 Yes Ashleyanne Hemmingway, Lurena Joiner, PA-C  acetaminophen (TYLENOL) 500 MG  tablet Take 2 tablets (1,000 mg total) by mouth every 6 (six) hours. 02/02/23   Setzer, Lynnell Jude, PA-C  apixaban (ELIQUIS) 2.5 MG TABS tablet Take 1 tablet (2.5 mg total) by mouth 2 (two) times daily. 02/02/23   Setzer, Lynnell Jude, PA-C  ascorbic acid (VITAMIN C) 500 MG tablet Take 1 tablet (500 mg total) by mouth 2 (two) times daily for 30 days then  as directed  by provider 02/02/23   Valetta Fuller, Lynnell Jude, PA-C  bacitracin ointment Apply topically as needed for wound care. 02/02/23   Setzer, Lynnell Jude, PA-C  magnesium gluconate (MAGONATE) 500 MG tablet Take 0.5 tablets (250 mg total) by mouth at bedtime. 02/02/23   Setzer, Lynnell Jude, PA-C  melatonin 5 MG TABS Take 1 tablet (5 mg total) by mouth at bedtime. 02/02/23   Setzer, Lynnell Jude, PA-C  methocarbamol (ROBAXIN) 750 MG tablet Take 1 tablet (750 mg total) by mouth at bedtime. 02/02/23   Setzer, Lynnell Jude, PA-C  mirtazapine (REMERON) 7.5 MG tablet Take 1 tablet (7.5 mg total) by mouth at bedtime. 02/02/23   Setzer, Lynnell Jude, PA-C  Prenatal Vit-Fe Fumarate-FA (PRENATAL MULTIVITAMIN) TABS tablet Take 1 tablet by mouth daily at 12 noon. 02/02/23   Setzer, Lynnell Jude, PA-C  thiamine (VITAMIN B1) 100 MG tablet Take 1 tablet (100 mg total) by mouth daily. 02/02/23   Setzer, Lynnell Jude, PA-C  Vitamin D, Ergocalciferol, (DRISDOL) 1.25 MG (50000 UNIT) CAPS capsule Take 1 capsule (50,000 Units total) by mouth every 7 (seven) days on Tuesdays for 5 weeks. 02/08/23   Setzer, Lynnell Jude, PA-C  vitamin D3 (CHOLECALCIFEROL) 25 MCG tablet Take 2 tablets (2,000 Units total) by mouth 2 (two) times daily. 02/02/23   Setzer, Lynnell Jude, PA-C    Family History Family History  Problem Relation Age of Onset   Healthy Mother    Healthy Father     Social History Social History   Tobacco Use   Smoking status: Never   Smokeless tobacco: Never  Vaping Use   Vaping status: Some Days  Substance Use Topics   Alcohol use: No   Drug use: Yes    Types: Marijuana     Allergies   Other   Review of Systems Review of Systems As per HPI  Physical Exam Triage Vital Signs ED Triage Vitals  Encounter Vitals Group     BP      Systolic BP Percentile      Diastolic BP Percentile      Pulse      Resp      Temp      Temp src      SpO2      Weight      Height      Head Circumference      Peak Flow      Pain Score      Pain Loc      Pain  Education      Exclude from Growth Chart    No data found.  Updated Vital Signs BP 118/78 (BP Location: Left Arm)   Pulse 68   Temp 98.4 F (36.9 C) (Oral)   Resp 18   LMP 06/25/2023   SpO2 98%    Physical Exam Vitals and nursing note reviewed.  Constitutional:      Appearance: Normal appearance.  Cardiovascular:     Rate and Rhythm: Normal rate and regular rhythm.     Heart sounds: Normal heart sounds.  Pulmonary:  Effort: Pulmonary effort is normal.     Breath sounds: Normal breath sounds.  Abdominal:     Palpations: Abdomen is soft.     Tenderness: There is no abdominal tenderness.  Skin:    General: Skin is warm and dry.  Neurological:     Mental Status: She is alert and oriented to person, place, and time.     UC Treatments / Results  Labs (all labs ordered are listed, but only abnormal results are displayed) Labs Reviewed - No data to display  EKG  Radiology No results found.  Procedures Procedures  Medications Ordered in UC Medications - No data to display  Initial Impression / Assessment and Plan / UC Course  I have reviewed the triage vital signs and the nursing notes.  Pertinent labs & imaging results that were available during my care of the patient were reviewed by me and considered in my medical decision making (see chart for details).  Cytology swab pending Treat for BV with flagyl BID x 7 Advised follow up with ob/gyn No questions at this time  Final Clinical Impressions(s) / UC Diagnoses   Final diagnoses:  BV (bacterial vaginosis)  Vaginal odor  Screen for STD (sexually transmitted disease)     Discharge Instructions      We will call you if anything on your swab returns positive. You can also see these results on MyChart. Please abstain from sexual intercourse until your results return.  I am treating you for BV - the flagyl is sent to your pharmacy. Take as prescribed. Do not drink alcohol while taking  Ob/gyn clinics  below - call to make appointment  You can scan the QR code on the last page to get established with a primary care provider      ED Prescriptions     Medication Sig Dispense Auth. Provider   metroNIDAZOLE (FLAGYL) 500 MG tablet Take 1 tablet (500 mg total) by mouth 2 (two) times daily for 7 days. 14 tablet Brelee Renk, Lurena Joiner, PA-C      PDMP not reviewed this encounter.   Marlow Baars, [place] 07/22/23 1316

## 2023-07-25 LAB — CERVICOVAGINAL ANCILLARY ONLY
Bacterial Vaginitis (gardnerella): POSITIVE — AB
Candida Glabrata: NEGATIVE
Candida Vaginitis: NEGATIVE
Chlamydia: NEGATIVE
Comment: NEGATIVE
Comment: NEGATIVE
Comment: NEGATIVE
Comment: NEGATIVE
Comment: NEGATIVE
Comment: NORMAL
Neisseria Gonorrhea: NEGATIVE
Trichomonas: NEGATIVE

## 2023-08-03 DIAGNOSIS — S32401A Unspecified fracture of right acetabulum, initial encounter for closed fracture: Secondary | ICD-10-CM | POA: Diagnosis not present

## 2023-08-03 DIAGNOSIS — S72001A Fracture of unspecified part of neck of right femur, initial encounter for closed fracture: Secondary | ICD-10-CM | POA: Diagnosis not present

## 2023-08-03 DIAGNOSIS — S3282XA Multiple fractures of pelvis without disruption of pelvic ring, initial encounter for closed fracture: Secondary | ICD-10-CM | POA: Diagnosis not present

## 2023-09-03 DIAGNOSIS — S32401A Unspecified fracture of right acetabulum, initial encounter for closed fracture: Secondary | ICD-10-CM | POA: Diagnosis not present

## 2023-09-03 DIAGNOSIS — S3282XA Multiple fractures of pelvis without disruption of pelvic ring, initial encounter for closed fracture: Secondary | ICD-10-CM | POA: Diagnosis not present

## 2023-09-03 DIAGNOSIS — S72001A Fracture of unspecified part of neck of right femur, initial encounter for closed fracture: Secondary | ICD-10-CM | POA: Diagnosis not present

## 2023-09-05 ENCOUNTER — Ambulatory Visit (HOSPITAL_COMMUNITY)
Admission: EM | Admit: 2023-09-05 | Discharge: 2023-09-05 | Disposition: A | Discharge status: Home / Self Care | Payer: Medicaid Other | Attending: Physician Assistant | Admitting: Physician Assistant

## 2023-09-05 ENCOUNTER — Encounter (HOSPITAL_COMMUNITY): Payer: Self-pay

## 2023-09-05 DIAGNOSIS — N898 Other specified noninflammatory disorders of vagina: Secondary | ICD-10-CM | POA: Diagnosis not present

## 2023-09-05 LAB — POCT URINE PREGNANCY: Preg Test, Ur: NEGATIVE

## 2023-09-05 NOTE — Discharge Instructions (Addendum)
We will keep you updated with the results of your cervicovaginal swab once it is available. If needed medications will be called in for you to the pharmacy on file.

## 2023-09-05 NOTE — ED Triage Notes (Signed)
Pt c/op of a foul vaginal odor thicker discharge. On 1/18 she did have some spotting and cramping and wants a pregnancy test.   Start Date: At least 3 weeks  Home Interventions: Boric Acid Douche, with some relief

## 2023-09-05 NOTE — ED Provider Notes (Signed)
[facility]    CSN: [id number] Arrival date & time: 09/05/23  1313      History   Chief Complaint Chief Complaint  Patient presents with   Possible Pregnancy   Vaginal Discharge    HPI Patricia [name] is a 23 y.o. female. She is here today for concerns of pregnancy and vaginal discharge.   HPI  She reports vaginal discharge changes along with vulvovaginal odor for the past few weeks  She has tried boric acid vaginal suppository which has provided some relief but not lasting.  She reports she has been having some light spotting. She is not sure if she is close to menses or not  She denies concerns for STDs at this time   Patient was previously treated for BV in Dec with Flagyl - she states she was clear of symptoms for about 2 weeks before symptoms restarted.    Past Medical History:  Diagnosis Date   Anemia    Depression    Suicide attempt by drug ingestion [facility])    Tuberculosis 2022    Patient Active Problem List   Diagnosis Date Noted   Recurrent major depression in partial remission (HCC) 02/02/2023   Closed fracture of right acetabulum (HCC) 01/28/2023   Closed right hip fracture (HCC) 01/28/2023   Bacterial vaginosis 01/25/2023   Retained tampon 01/25/2023   Heterotopic ossification 01/25/2023   Chlamydia 01/25/2023   Multiple pelvic fractures (HCC) 01/22/2023   Adjustment disorder with mixed anxiety and depressed mood 01/21/2023   Vaginal discharge 01/29/2022   Irregular periods 02/02/2020   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   Right upper lobe pulmonary infiltrate 01/28/2020   Supraclavicular adenopathy    Positive QuantiFERON-TB Gold test    Weight loss 01/24/2020   Anemia 01/23/2020   Suicide attempt by placing self in path of moving vehicle [facility])    Depression 05/24/2015   Overdose 05/23/2015   Suicide attempt by acetaminophen overdose (HCC) 05/23/2015    Past Surgical History:  Procedure Laterality Date   BRONCHIAL BRUSHINGS  01/30/2020    Procedure: BRONCHIAL BRUSHINGS;  Surgeon: Lorin Glass, MD;  Location: [facility] ENDOSCOPY;  Service: Pulmonary;;   BRONCHIAL NEEDLE ASPIRATION BIOPSY  01/30/2020   Procedure: BRONCHIAL NEEDLE ASPIRATION BIOPSIES;  Surgeon: Lorin Glass, MD;  Location: [facility] ENDOSCOPY;  Service: Pulmonary;;   BRONCHIAL WASHINGS  01/30/2020   Procedure: BRONCHIAL WASHINGS;  Surgeon: Lorin Glass, MD;  Location: [facility] ENDOSCOPY;  Service: Pulmonary;;   CLOSED REDUCTION FINGER WITH PERCUTANEOUS PINNING  01/22/2023   Procedure: CLOSED REDUCTION FINGER WITH PERCUTANEOUS PINNING;  Surgeon: Joen Laura, MD;  [facility];  Service: Orthopedics;;   FOREIGN BODY REMOVAL VAGINAL  01/24/2023   Procedure: REMOVAL FOREIGN BODY VAGINAL;  Surgeon: [place] Bing, MD;  Location: [facility] OR;  Service: Gynecology;;   HIP CLOSED REDUCTION Right 01/22/2023   Procedure: CLOSED REDUCTION HIP;  Surgeon: Joen Laura, MD;  [facility];  Service: Orthopedics;  Laterality: Right;   INSERTION OF TRACTION PIN Right 01/22/2023   Procedure: SKELETAL TRACTION APPLICATION;  Surgeon: Joen Laura, MD;  [facility];  Service: Orthopedics;  Laterality: Right;   ORIF ACETABULAR FRACTURE Right 01/24/2023   Procedure: OPEN REDUCTION INTERNAL FIXATION (ORIF) ACETABULAR FRACTURE AND PELVIC RING FRACTURE;  Surgeon: Myrene Galas, MD;  [facility];  Service: Orthopedics;  Laterality: Right;   VIDEO BRONCHOSCOPY WITH ENDOBRONCHIAL ULTRASOUND N/A 01/30/2020   Procedure: VIDEO BRONCHOSCOPY WITH ENDOBRONCHIAL ULTRASOUND;  Surgeon: Lorin Glass, MD;  [facility] ENDOSCOPY;  Service: Pulmonary;  Laterality: N/A;    OB History   No obstetric history on file.      Home Medications    Prior to Admission medications   Medication Sig Start Date End Date Taking? Authorizing Provider  acetaminophen (TYLENOL) 500 MG tablet Take 2 tablets (1,000 mg total) by mouth every 6 (six) hours. 02/02/23   Setzer, Lynnell Jude, PA-C  apixaban  (ELIQUIS) 2.5 MG TABS tablet Take 1 tablet (2.5 mg total) by mouth 2 (two) times daily. 02/02/23   Setzer, Lynnell Jude, PA-C  ascorbic acid (VITAMIN C) 500 MG tablet Take 1 tablet (500 mg total) by mouth 2 (two) times daily for 30 days then  as directed by provider 02/02/23   Valetta Fuller, Lynnell Jude, PA-C  bacitracin ointment Apply topically as needed for wound care. 02/02/23   Setzer, Lynnell Jude, PA-C  magnesium gluconate (MAGONATE) 500 MG tablet Take 0.5 tablets (250 mg total) by mouth at bedtime. 02/02/23   Setzer, Lynnell Jude, PA-C  melatonin 5 MG TABS Take 1 tablet (5 mg total) by mouth at bedtime. 02/02/23   Setzer, Lynnell Jude, PA-C  methocarbamol (ROBAXIN) 750 MG tablet Take 1 tablet (750 mg total) by mouth at bedtime. 02/02/23   Setzer, Lynnell Jude, PA-C  mirtazapine (REMERON) 7.5 MG tablet Take 1 tablet (7.5 mg total) by mouth at bedtime. 02/02/23   Setzer, Lynnell Jude, PA-C  Prenatal Vit-Fe Fumarate-FA (PRENATAL MULTIVITAMIN) TABS tablet Take 1 tablet by mouth daily at 12 noon. 02/02/23   Setzer, Lynnell Jude, PA-C  thiamine (VITAMIN B1) 100 MG tablet Take 1 tablet (100 mg total) by mouth daily. 02/02/23   Setzer, Lynnell Jude, PA-C  Vitamin D, Ergocalciferol, (DRISDOL) 1.25 MG (50000 UNIT) CAPS capsule Take 1 capsule (50,000 Units total) by mouth every 7 (seven) days on Tuesdays for 5 weeks. 02/08/23   Setzer, Lynnell Jude, PA-C  vitamin D3 (CHOLECALCIFEROL) 25 MCG tablet Take 2 tablets (2,000 Units total) by mouth 2 (two) times daily. 02/02/23   Setzer, Lynnell Jude, PA-C    Family History Family History  Problem Relation Age of Onset   Healthy Mother    Healthy Father     Social History Social History   Tobacco Use   Smoking status: Never   Smokeless tobacco: Never  Vaping Use   Vaping status: Some Days  Substance Use Topics   Alcohol use: No   Drug use: Yes    Types: Marijuana     Allergies   Other   Review of Systems Review of Systems  Constitutional:  Negative for chills and fever.  Genitourinary:  Positive  for vaginal bleeding (she report light spotting) and vaginal discharge. Negative for difficulty urinating, dysuria, flank pain, frequency, genital sores and vaginal pain.     Physical Exam Triage Vital Signs ED Triage Vitals  Encounter Vitals Group     BP 09/05/23 1516 104/73     Systolic BP Percentile --      Diastolic BP Percentile --      Pulse Rate 09/05/23 1516 74     Resp 09/05/23 1516 18     Temp 09/05/23 1516 98.4 F (36.9 C)     Temp Source 09/05/23 1516 Oral     SpO2 09/05/23 1516 95 %     Weight --      Height --      Head Circumference --      Peak Flow --      Pain Score 09/05/23 1509  0     Pain Loc --      Pain Education --      Exclude from Growth Chart --    No data found.  Updated Vital Signs BP 104/73 (BP Location: Left Arm)   Pulse 74   Temp 98.4 F (36.9 C) (Oral)   Resp 18   LMP 06/25/2023   SpO2 95%   Visual Acuity Right Eye Distance:   Left Eye Distance:   Bilateral Distance:    Right Eye Near:   Left Eye Near:    Bilateral Near:     Physical Exam Vitals reviewed.  Constitutional:      General: She is awake.     Appearance: Normal appearance. She is well-developed and well-groomed.  HENT:     Head: Normocephalic and atraumatic.  Eyes:     General: Lids are normal. Gaze aligned appropriately.     Extraocular Movements: Extraocular movements intact.     Conjunctiva/sclera: Conjunctivae normal.  Pulmonary:     Effort: Pulmonary effort is normal.  Neurological:     General: No focal deficit present.     Mental Status: She is alert and oriented to person, place, and time.     GCS: GCS eye subscore is 4. GCS verbal subscore is 5. GCS motor subscore is 6.     Cranial Nerves: No cranial nerve deficit, dysarthria or facial asymmetry.  Psychiatric:        Attention and Perception: Attention and perception normal.        Mood and Affect: Mood and affect normal.        Speech: Speech normal.        Behavior: Behavior normal. Behavior is  cooperative.      UC Treatments / Results  Labs (all labs ordered are listed, but only abnormal results are displayed) Labs Reviewed  POCT URINE PREGNANCY  CERVICOVAGINAL ANCILLARY ONLY    EKG   Radiology No results found.  Procedures Procedures (including critical care time)  Medications Ordered in UC Medications - No data to display  Initial Impression / Assessment and Plan / UC Course  I have reviewed the triage vital signs and the nursing notes.  Pertinent labs & imaging results that were available during my care of the patient were reviewed by me and considered in my medical decision making (see chart for details).      Final Clinical Impressions(s) / UC Diagnoses   Final diagnoses:  Vaginal discharge   Acute, new concern Patient reports concerns for vaginal discharge changes for several weeks that is not improving with home measures.  Cervicovaginal swab collected- Results to dictate further management  Reviewed results of pregnancy testing with her during apt.  Patient would like results and AVS sent to Mychart.  Follow up as needed for persistent or progressing symptoms     Discharge Instructions      We will keep you updated with the results of your cervicovaginal swab once it is available. If needed medications will be called in for you to the pharmacy on file.      ED Prescriptions   None    PDMP not reviewed this encounter.   Milla Wahlberg, Oswaldo Conroy, PA-C 09/05/23 1600

## 2023-09-06 LAB — CERVICOVAGINAL ANCILLARY ONLY
Bacterial Vaginitis (gardnerella): POSITIVE — AB
Candida Glabrata: NEGATIVE
Candida Vaginitis: NEGATIVE
Chlamydia: NEGATIVE
Comment: NEGATIVE
Comment: NEGATIVE
Comment: NEGATIVE
Comment: NEGATIVE
Comment: NEGATIVE
Comment: NORMAL
Neisseria Gonorrhea: NEGATIVE
Trichomonas: NEGATIVE

## 2023-09-08 ENCOUNTER — Telehealth (HOSPITAL_COMMUNITY): Payer: Self-pay

## 2023-09-08 MED ORDER — METRONIDAZOLE 500 MG PO TABS: 500.0000 mg | ORAL_TABLET | Freq: Two times a day (BID) | ORAL | 0 refills | Status: AC

## 2023-09-08 NOTE — Telephone Encounter (Signed)
Per protocol, pt requires tx with metronidazole. Rx sent to pharmacy on file.

## 2023-10-04 DIAGNOSIS — S3282XA Multiple fractures of pelvis without disruption of pelvic ring, initial encounter for closed fracture: Secondary | ICD-10-CM | POA: Diagnosis not present

## 2023-10-04 DIAGNOSIS — S32401A Unspecified fracture of right acetabulum, initial encounter for closed fracture: Secondary | ICD-10-CM | POA: Diagnosis not present

## 2023-10-04 DIAGNOSIS — S72001A Fracture of unspecified part of neck of right femur, initial encounter for closed fracture: Secondary | ICD-10-CM | POA: Diagnosis not present

## 2023-11-01 DIAGNOSIS — S3282XA Multiple fractures of pelvis without disruption of pelvic ring, initial encounter for closed fracture: Secondary | ICD-10-CM | POA: Diagnosis not present

## 2023-11-01 DIAGNOSIS — S72001A Fracture of unspecified part of neck of right femur, initial encounter for closed fracture: Secondary | ICD-10-CM | POA: Diagnosis not present

## 2023-11-01 DIAGNOSIS — S32401A Unspecified fracture of right acetabulum, initial encounter for closed fracture: Secondary | ICD-10-CM | POA: Diagnosis not present

## 2023-12-02 DIAGNOSIS — S3282XA Multiple fractures of pelvis without disruption of pelvic ring, initial encounter for closed fracture: Secondary | ICD-10-CM | POA: Diagnosis not present

## 2023-12-02 DIAGNOSIS — S72001A Fracture of unspecified part of neck of right femur, initial encounter for closed fracture: Secondary | ICD-10-CM | POA: Diagnosis not present

## 2023-12-02 DIAGNOSIS — S32401A Unspecified fracture of right acetabulum, initial encounter for closed fracture: Secondary | ICD-10-CM | POA: Diagnosis not present

## 2024-02-20 ENCOUNTER — Other Ambulatory Visit: Payer: Self-pay

## 2024-04-03 ENCOUNTER — Ambulatory Visit: Admitting: Student

## 2024-04-16 ENCOUNTER — Telehealth: Payer: Self-pay

## 2024-04-16 NOTE — Telephone Encounter (Signed)
 Patient calls nurse line requesting to speak with PCP.   She reports she is needing a work accomodation form filled out.   Advised she would need an apt as she has not been seen since 2023.  Patient reports she has moved to [place].  She reports she will call back to schedule once she knows her schedule for travel.

## 2024-04-19 NOTE — Progress Notes (Unsigned)
    SUBJECTIVE:   CHIEF COMPLAINT / HPI:   Needs work accomodation form Started new job at [employer] where she is lifting very heavy boxes, required to get from place to place and under 5 minutes, climbing up and down ladders Unable to do these tasks due to pain in hip from ortho surgery Has not followed with PT  Would also like STD testing, no symptoms, LMP 04/15/24  PERTINENT  PMH / PSH: ORIF acetabular fx and pelvic ring fx  OBJECTIVE:   BP 116/73   Pulse 89   Ht 5' 2 (1.575 m)   Wt 142 lb (64.4 kg)   LMP 04/15/2024   SpO2 98%   BMI 25.97 kg/m   General: well appearing, NAD Respiratory: normal work of breathing on RA Ambulating independently without difficulty  ASSESSMENT/PLAN:   Assessment & Plan Closed fracture of right hip, sequela Filled out work accommodations for this patient secondary to pain from prior right hip fracture requiring ORIF Encouraged to follow-up with physical therapy and orthopedics when she establishes with her new primary care doctor this month Screening examination for STD (sexually transmitted disease) Self swab for gonorrhea, chlamydia, trichomonas HIV, RPR testing Encounter for immunization Received flu shot   Patient has appointment with new PCP in [place] at the end of this month.  Advised to get her Pap completed  Elyce Prescott, DO [facility]

## 2024-04-20 ENCOUNTER — Ambulatory Visit (INDEPENDENT_AMBULATORY_CARE_PROVIDER_SITE_OTHER): Admitting: Family Medicine

## 2024-04-20 ENCOUNTER — Encounter: Payer: Self-pay | Admitting: Family Medicine

## 2024-04-20 ENCOUNTER — Other Ambulatory Visit (HOSPITAL_COMMUNITY)
Admission: RE | Admit: 2024-04-20 | Discharge: 2024-04-20 | Disposition: A | Discharge status: Home / Self Care | Source: Ambulatory Visit

## 2024-04-20 VITALS — BP 116/73 | HR 89 | Ht 62.0 in | Wt 142.0 lb

## 2024-04-20 DIAGNOSIS — S72001S Fracture of unspecified part of neck of right femur, sequela: Secondary | ICD-10-CM

## 2024-04-20 DIAGNOSIS — Z113 Encounter for screening for infections with a predominantly sexual mode of transmission: Secondary | ICD-10-CM | POA: Insufficient documentation

## 2024-04-20 DIAGNOSIS — Z23 Encounter for immunization: Secondary | ICD-10-CM | POA: Diagnosis not present

## 2024-04-20 NOTE — Assessment & Plan Note (Signed)
 Filled out work accommodations for this patient secondary to pain from prior right hip fracture requiring ORIF Encouraged to follow-up with physical therapy and orthopedics when she establishes with her new primary care doctor this month

## 2024-04-20 NOTE — Assessment & Plan Note (Signed)
" >>  ASSESSMENT AND PLAN FOR CLOSED RIGHT HIP FRACTURE (HCC) WRITTEN ON 04/20/2024 10:28 AM BY Alisea Matte, DO  Filled out work accommodations for this patient secondary to pain from prior right hip fracture requiring ORIF Encouraged to follow-up with physical therapy and orthopedics when she establishes with her new primary care doctor this month "

## 2024-04-20 NOTE — Patient Instructions (Signed)
 Good to see you today - Thank you for coming in  Things we discussed today:  I will let you know if any of your STD screening is abnormal.  Please discuss with your new doctor getting back in with physical therapy and your iron  deficiency

## 2024-04-21 ENCOUNTER — Ambulatory Visit: Payer: Self-pay | Admitting: Family Medicine

## 2024-04-21 LAB — HIV ANTIBODY (ROUTINE TESTING W REFLEX): HIV Screen 4th Generation wRfx: NONREACTIVE

## 2024-04-21 LAB — RPR: RPR Ser Ql: NONREACTIVE

## 2024-04-23 LAB — CERVICOVAGINAL ANCILLARY ONLY
Chlamydia: NEGATIVE
Comment: NEGATIVE
Comment: NEGATIVE
Comment: NORMAL
Neisseria Gonorrhea: NEGATIVE
Trichomonas: NEGATIVE

## 2024-06-05 ENCOUNTER — Ambulatory Visit (HOSPITAL_COMMUNITY)
Admission: EM | Admit: 2024-06-05 | Discharge: 2024-06-05 | Disposition: A | Discharge status: Home / Self Care | Attending: Emergency Medicine | Admitting: Emergency Medicine

## 2024-06-05 ENCOUNTER — Encounter (HOSPITAL_COMMUNITY): Payer: Self-pay

## 2024-06-05 DIAGNOSIS — Z202 Contact with and (suspected) exposure to infections with a predominantly sexual mode of transmission: Secondary | ICD-10-CM | POA: Diagnosis present

## 2024-06-05 DIAGNOSIS — Z113 Encounter for screening for infections with a predominantly sexual mode of transmission: Secondary | ICD-10-CM | POA: Diagnosis present

## 2024-06-05 DIAGNOSIS — Z3200 Encounter for pregnancy test, result unknown: Secondary | ICD-10-CM | POA: Diagnosis present

## 2024-06-05 LAB — POCT URINE PREGNANCY: Preg Test, Ur: NEGATIVE

## 2024-06-05 NOTE — ED Triage Notes (Signed)
 Patient is requesting STD testing and blood work as well.Patient denies any symptoms. I just want to get it checked.  Patient also is requesting blood work to test for anemia. Patient states she had to have a blood transfusion last year and she has been having low energy.  Patient also c/o right hip pain and states she has her implant in the right hip as well.
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
## 2024-06-05 NOTE — Discharge Instructions (Addendum)
 The results of your vaginal swab test which screens for gonorrhea, chlamydia and trichomonas will be posted to your MyChart account once it is complete.  This typically takes 1 to 2 days.  Please abstain from sexual intercourse of any kind, vaginal, oral or anal, until you have received the results of your STD testing.      If any of your results are abnormal, you will receive a phone call regarding treatment.  Prescriptions, if any are needed, will be provided for you at your pharmacy.      Your urine pregnancy test today is negative.     Please remember that there are only 2 ways to prevent transmission of sexually transmitted disease: to not have sex or to always use condoms when having sex.   Please follow-up with your primary care provider regarding routine blood work and your chronic right hip pain.      Thank you for visiting [facility] today.  We appreciate the opportunity to participate in your care.

## 2024-06-06 ENCOUNTER — Ambulatory Visit: Payer: Self-pay | Admitting: Emergency Medicine

## 2024-06-06 LAB — CERVICOVAGINAL ANCILLARY ONLY
Chlamydia: NEGATIVE
Comment: NEGATIVE
Comment: NEGATIVE
Comment: NORMAL
Neisseria Gonorrhea: NEGATIVE
Trichomonas: NEGATIVE

## 2024-06-06 LAB — RPR: RPR Ser Ql: NONREACTIVE

## 2024-06-19 ENCOUNTER — Ambulatory Visit: Admitting: Family Medicine

## 2024-06-19 NOTE — Progress Notes (Deleted)
    SUBJECTIVE:   CHIEF COMPLAINT / HPI: hip injury  Discussed the use of AI scribe software for clinical note transcription with the patient, who gave verbal consent to proceed.  History of Present Illness     PERTINENT  PMH / PSH: Hx of Right Hip ORIF  OBJECTIVE:   LMP 04/21/2024 (Exact Date)   Physical Exam    ASSESSMENT/PLAN:   Assessment & Plan  Assessment and Plan Assessment & Plan      No follow-ups on file.  Patricia Provencal, MD, PGY-3 [facility] 8:10 AM 06/19/2024  [facility]

## 2024-08-03 ENCOUNTER — Ambulatory Visit (HOSPITAL_COMMUNITY)
Admission: EM | Admit: 2024-08-03 | Discharge: 2024-08-03 | Disposition: A | Discharge status: Home / Self Care | Payer: Self-pay | Attending: Physician Assistant | Admitting: Physician Assistant

## 2024-08-03 ENCOUNTER — Encounter (HOSPITAL_COMMUNITY): Payer: Self-pay | Admitting: Emergency Medicine

## 2024-08-03 DIAGNOSIS — Z3202 Encounter for pregnancy test, result negative: Secondary | ICD-10-CM

## 2024-08-03 DIAGNOSIS — Z113 Encounter for screening for infections with a predominantly sexual mode of transmission: Secondary | ICD-10-CM | POA: Insufficient documentation

## 2024-08-03 DIAGNOSIS — N898 Other specified noninflammatory disorders of vagina: Secondary | ICD-10-CM | POA: Insufficient documentation

## 2024-08-03 DIAGNOSIS — L720 Epidermal cyst: Secondary | ICD-10-CM | POA: Insufficient documentation

## 2024-08-03 LAB — HIV ANTIBODY (ROUTINE TESTING W REFLEX): HIV Screen 4th Generation wRfx: NONREACTIVE

## 2024-08-03 LAB — POCT URINE PREGNANCY: Preg Test, Ur: NEGATIVE

## 2024-08-03 MED ORDER — METRONIDAZOLE 500 MG PO TABS: 500.0000 mg | ORAL_TABLET | Freq: Two times a day (BID) | ORAL | 0 refills | Status: AC

## 2024-08-03 NOTE — Discharge Instructions (Addendum)
 We are treating you for bacterial vaginosis.  Start metronidazole  twice daily for 7 days.  Do not drink any alcohol while on this medication and for 3 days after you finish it as several cause you to vomit.  Wear loosefitting cotton underwear and use hypoallergenic soaps and detergents.  We will contact you if any of your other testing is positive.  I recommend abstaining from sex until you receive your results.  If you develop any pelvic pain, abdominal pain, fever, nausea, vomiting you need to be seen immediately.  Your urine pregnancy test was negative.  I believe that this is a cyst.  It does not currently appear to be infected.  Follow-up with dermatology to determine if it needs to be removed.  If it becomes red, swollen, painful, draining please return so we can look at it again and determine if we need to start medication.
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
## 2024-08-03 NOTE — ED Triage Notes (Signed)
 PT REPORTS FOR MONTH HAD THICK WHITE VAGINAL DISCHARGE WITH FISHY SMELL. PT REQUESTING STD TESTING.  PT ALSO WOULD LIKE A CYST ON LEFT SIDE OF FACE FOR 2 WEEKS LOOKED AT.

## 2024-08-04 LAB — SYPHILIS: RPR W/REFLEX TO RPR TITER AND TREPONEMAL ANTIBODIES, TRADITIONAL SCREENING AND DIAGNOSIS ALGORITHM: RPR Ser Ql: NONREACTIVE

## 2024-08-06 ENCOUNTER — Ambulatory Visit (HOSPITAL_COMMUNITY): Payer: Self-pay

## 2024-08-06 LAB — CERVICOVAGINAL ANCILLARY ONLY
Bacterial Vaginitis (gardnerella): POSITIVE — AB
Candida Glabrata: NEGATIVE
Candida Vaginitis: NEGATIVE
Chlamydia: NEGATIVE
Comment: NEGATIVE
Comment: NEGATIVE
Comment: NEGATIVE
Comment: NEGATIVE
Comment: NEGATIVE
Comment: NORMAL
Neisseria Gonorrhea: NEGATIVE
Trichomonas: NEGATIVE

## 2024-08-20 ENCOUNTER — Ambulatory Visit: Payer: Self-pay | Admitting: Family Medicine

## 2024-08-20 ENCOUNTER — Encounter: Payer: Self-pay | Admitting: Family Medicine

## 2024-08-20 VITALS — BP 106/64 | HR 72 | Ht 62.0 in | Wt 130.5 lb

## 2024-08-20 DIAGNOSIS — G8929 Other chronic pain: Secondary | ICD-10-CM | POA: Insufficient documentation

## 2024-08-20 DIAGNOSIS — M25551 Pain in right hip: Secondary | ICD-10-CM

## 2024-08-20 NOTE — Patient Instructions (Addendum)
 It was great to see you! Thank you for allowing me to participate in your care!  Our plans for today:   VISIT SUMMARY: You visited today due to severe hip pain and requested a doctor's note for work absence. We discussed your hip pain, its impact on your daily activities, and your history of hip repair.  YOUR PLAN: CHRONIC RIGHT HIP PAIN: Your hip pain is worsened by work activities and has been severe since January 3rd. You have not been using regular pain medication or participating in physical therapy. -Perform home hip exercises as provided. -Take Tylenol  up to 1000 mg and ibuprofen  600 mg for flare-ups. -You have been given a work absence note for January 3rd, 2026. -Follow up for a physical therapy referral once you establish primary care in [place] or [place].  GENERAL HEALTH MAINTENANCE: We discussed the importance of getting a flu vaccine and establishing care with a primary care provider. -Get a flu vaccine if you have not already received one. -Establish care with a primary care provider in [place] if that is where you end up -Find a new primary care provider to ensure continuity of care.     Please arrive 15 minutes PRIOR to your next scheduled appointment time! If you do not, this affects OTHER patients' care.  Take care and seek immediate care sooner if you develop any concerns.   Ozell Provencal, MD, PGY-3 [facility] 12:12 PM 08/20/2024  [facility]
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
